# Patient Record
Sex: Male | Born: 1995
Health system: Southern US, Community
[De-identification: ages and names within clinical notes are randomized; demographics above are authoritative.]

## PROBLEM LIST (undated history)

## (undated) DIAGNOSIS — E1110 Type 2 diabetes mellitus with ketoacidosis without coma: Secondary | ICD-10-CM

## (undated) HISTORY — PX: TONSILLECTOMY: SUR1361

---

## 2016-05-09 ENCOUNTER — Encounter (HOSPITAL_COMMUNITY): Payer: Self-pay | Admitting: Emergency Medicine

## 2016-05-09 ENCOUNTER — Ambulatory Visit (HOSPITAL_COMMUNITY)
Admission: EM | Admit: 2016-05-09 | Discharge: 2016-05-09 | Disposition: A | Discharge status: Home / Self Care | Payer: Self-pay | Attending: Family Medicine | Admitting: Family Medicine

## 2016-05-09 DIAGNOSIS — R042 Hemoptysis: Secondary | ICD-10-CM

## 2016-05-09 NOTE — Discharge Instructions (Signed)
Stop making yourself spit up. Get some humidifier to use at home. Let your throat rest. If you constantly trying to spit up, I am afraid that it will further irritate your throat. This will get better. If this persist next week, return or f/u with a primary care doctor for re-evaluation.

## 2016-05-09 NOTE — ED Triage Notes (Signed)
PT reports he has been spitting blood out all day. PT reports that he is not coughing blood up nor throwing up blood. PT believes it is coming from his throat. It started this AM. PT has no recent procedures. PT denies nose bleeds

## 2016-05-09 NOTE — ED Provider Notes (Signed)
CSN: [id number]     Arrival date & time 05/09/16  1650 History   None    Chief Complaint  Patient presents with  . Hemoptysis   (Consider location/radiation/quality/duration/timing/severity/associated sxs/prior Treatment) Patient is a healthy 20 y.o male, woke up this morning and spit and incidentally noticed that he got blood in his spit. Since then, he has constantly spit up on purpose and continues to notice blood in his spit. He reports the blood to be bright red blood. He is otherwise asymptomatic without any URI symptoms, sore throat, coughing, vomiting, abdominal pain, shortness of breath, dental pain, mouth bleeding, or dizziness. Patient is a non-smoker. Patient denies any pain or difficulty swallow. He also doesn't feel the urge or the need to spit.       History reviewed. No pertinent past medical history. Past Surgical History:  Procedure Laterality Date  . TONSILLECTOMY     No family history on file. Social History  Substance Use Topics  . Smoking status: Never Smoker  . Smokeless tobacco: Never Used  . Alcohol use Yes     Comment: rarely    Review of Systems  All other systems reviewed and are negative.   Allergies  Patient has no known allergies.  Home Medications   Prior to Admission medications   Not on File   Meds Ordered and Administered this Visit  Medications - No data to display  BP 125/73   Pulse 83   Temp 99.4 F (37.4 C) (Oral)   Resp 16   Ht 6' (1.829 m)   Wt (!) 320 lb (145.2 kg)   SpO2 100%   BMI 43.40 kg/m  No data found.   Physical Exam  Constitutional: He is oriented to person, place, and time. He appears well-developed and well-nourished.  HENT:  Head: Normocephalic and atraumatic.  Right Ear: External ear normal.  Left Ear: External ear normal.  Nose: Nose normal.  Mouth/Throat: Oropharynx is clear and moist. No oropharyngeal exudate.  TM normal bilaterally  Eyes: EOM are normal. Pupils are equal, round, and reactive  to light. Right eye exhibits no discharge. Left eye exhibits no discharge.  Neck: Normal range of motion. Neck supple.  Cardiovascular: Normal rate, regular rhythm and normal heart sounds.   Pulmonary/Chest: Effort normal and breath sounds normal. No respiratory distress. He has no wheezes.  Abdominal: Soft. Bowel sounds are normal. He exhibits no distension. There is no tenderness.  Musculoskeletal: Normal range of motion.  Lymphadenopathy:    He has no cervical adenopathy.  Neurological: He is alert and oriented to person, place, and time.  Skin: Skin is warm and dry.  Nursing note and vitals reviewed.     Urgent Care Course   Clinical Course     Procedures (including critical care time)  Labs Review Labs Reviewed - No data to display  Imaging Review No results found.   MDM   1. Spitting up blood    Patient purposely spit up a couple times in room to show me what he was talking about. The first two times he spit up, the mucus appear normal without any steaks of redness in it. The third spit, there is some blood in it (see picture above). I am not concern about this as he is not coughing or throwing up blood. This could be secondary to an upper respiratory irritation. Patient informed to monitor symptoms. Informed that this should improved. Patient informed to stop constantly trying to spit up to avoid further irritation  of the throat. Informed to return or f/u if symptoms persist next week.      Lucia EstelleFeng Kimberely Mccannon, NP 05/09/16 [phone]

## 2018-05-11 DIAGNOSIS — E1110 Type 2 diabetes mellitus with ketoacidosis without coma: Secondary | ICD-10-CM

## 2018-05-11 HISTORY — DX: Type 2 diabetes mellitus with ketoacidosis without coma: E11.10

## 2018-05-19 ENCOUNTER — Ambulatory Visit (INDEPENDENT_AMBULATORY_CARE_PROVIDER_SITE_OTHER): Payer: PRIVATE HEALTH INSURANCE

## 2018-05-19 ENCOUNTER — Ambulatory Visit (HOSPITAL_COMMUNITY)
Admission: EM | Admit: 2018-05-19 | Discharge: 2018-05-19 | Disposition: A | Discharge status: Home / Self Care | Payer: PRIVATE HEALTH INSURANCE | Attending: Family Medicine | Admitting: Family Medicine

## 2018-05-19 ENCOUNTER — Encounter (HOSPITAL_COMMUNITY): Payer: Self-pay | Admitting: Emergency Medicine

## 2018-05-19 DIAGNOSIS — E1165 Type 2 diabetes mellitus with hyperglycemia: Secondary | ICD-10-CM

## 2018-05-19 DIAGNOSIS — R1013 Epigastric pain: Secondary | ICD-10-CM

## 2018-05-19 DIAGNOSIS — K5900 Constipation, unspecified: Secondary | ICD-10-CM

## 2018-05-19 LAB — GLUCOSE, CAPILLARY: Glucose-Capillary: 330 mg/dL — ABNORMAL HIGH (ref 70–99)

## 2018-05-19 MED ORDER — METFORMIN HCL 500 MG PO TABS: 500.0000 mg | ORAL_TABLET | Freq: Two times a day (BID) | ORAL | 0 refills | Status: DC

## 2018-05-19 NOTE — ED Triage Notes (Signed)
Pt sts abd pain and some vomiting x 2 weeks

## 2018-05-19 NOTE — Discharge Instructions (Addendum)
You have been diagnosed with diabetes. It is very important that you establish care with a primary care doctor as this will need regular follow up to manage.  You may try over the counter MIRALAX over the next 2-3 days. This has a good chance of helping your constipation which may be causing some of the symptoms you are having.  Please do your best to ensure adequate fluid intake in order to avoid dehydration. If you find that you are unable to tolerate drinking fluids regularly please proceed to the Emergency Department for evaluation.

## 2018-05-19 NOTE — ED Provider Notes (Signed)
[facility]   [id number] 05/19/18 Arrival Time: 1122  ASSESSMENT & PLAN:  1. Epigastric pain   2. Constipation, unspecified constipation type   3. Type 2 diabetes mellitus with hyperglycemia, without long-term current use of insulin (HCC)    New diagnosis of diabetes. FSBS 330 here this morning. I stressed the importance of starting on Metformin and limiting his sugar intake. As he does not have a PCP, I would like him to return here in 48 hours for recheck, sooner if needed. Encouraged adequate fluid intake until follow up.  I have personally viewed the imaging studies ordered this visit. No sign of bowel obstruction. Much stool. No further indication at this time for additional imaging.  Meds ordered this encounter  Medications  . metFORMIN (GLUCOPHAGE) 500 MG tablet    Sig: Take 1 tablet (500 mg total) by mouth 2 (two) times daily with a meal.    Dispense:  60 tablet    Refill:  0     Discharge Instructions     You have been seen today for abdominal pain. Your evaluation was not suggestive of any emergent condition requiring medical intervention at this time. However, some abdominal problems make take more time to appear. Therefore, it's very important for you to pay attention to any new symptoms or worsening of your current condition.  Please return here or to the Emergency Department immediately should you feel worse in any way or have any of the following symptoms: increasing or different abdominal pain, persistent vomiting, fevers, or shaking chills.      Recommend that he establish care with a PCP ASAP, esp with a new diagnosis of DM. He agrees.  Follow-up Information    Schedule an appointment as soon as possible for a [facility]  [facility]   Specialty:  Family Medicine Contact information: [address] [place] [id number] [phone]       [facility].     Specialty:  Emergency Medicine Why:  If symptoms worsen. Contact information: [address] [id number] [place] [id number] [phone]         Reviewed expectations re: course of current medical issues. Questions answered. Outlined signs and symptoms indicating need for more acute intervention. Patient verbalized understanding. After Visit Summary given.   SUBJECTIVE: History from: patient. Erik Harper is a 22 y.o. male who presents with complaint of intermittent epigastric abdominal discomfort along with nausea and vomiting at times. Onset gradual, between 2 and 3 weeks ago. Discomfort described as dull when present; without radiation. May last up to half an hour sometimes. Nausea usually post-prandial with sporadic associated non-bloody vomiting. Symptoms are unchanged since beginning. Fever: absent. Aggravating factors: eating (does tolerate PO fluids without problem). Alleviating factors: have not been identified. Associated symptoms: constipation. Reports last normal bowel movement "last week I think." A few small hard stools a couple of days ago. No diarrhea or loose bowel movements. He denies arthralgias, belching, dysuria, headache, myalgias and sweats. Appetite: normal. PO intake: decreased secondary to above symptoms. Ambulatory without assistance. Urinary symptoms: no dysuria or urinary frequency reported. Has had trouble with constipation in the past. Is passing gas from rectum. Also reports increased thirst over the past few weeks but no specific urinary frequency. OTC treatment: none reported.  Past Surgical History:  Procedure Laterality Date  . TONSILLECTOMY     Social History   Tobacco Use  Smoking Status Never Smoker  Smokeless  Tobacco Never Used   ROS: As per HPI. All other systems negative.  OBJECTIVE:  Vitals:   05/19/18 1309  BP: (!) 156/99  Pulse: (!) 124  Resp: 18  Temp: 97.9 F (36.6 C)  TempSrc: Oral  SpO2: 100%     General appearance: alert, oriented, no acute distress and obese  HEENT: oropharynx moist Lungs: clear to auscultation bilaterally; unlabored respirations Heart: tachycardia noted (recheck 108); regular Abdomen: soft; without distention; mild tenderness, epigastric; normal bowel sounds; without masses or organomegaly; without guarding or rebound tenderness Back: without CVA tenderness; FROM at waist Extremities: without LE edema; symmetrical; without gross deformities Skin: warm and dry Neurologic: normal gait Psychological: alert and cooperative; normal mood and affect  Labs:  Labs Reviewed  GLUCOSE, CAPILLARY - Abnormal; Notable for the following components:      Result Value   Glucose-Capillary 330 (*)    All other components within normal limits  CBG MONITORING, ED   Imaging: Dg Chest Port 1 View  Result Date: 05/21/2018 CLINICAL DATA:  22 year old male with epigastric pain. EXAM: PORTABLE CHEST 1 VIEW COMPARISON:  None. FINDINGS: The heart size and mediastinal contours are within normal limits. Both lungs are clear. The visualized skeletal structures are unremarkable. IMPRESSION: No active disease. Electronically Signed   By: Elgie Collard M.D.   On: 05/21/2018 06:11     No Known Allergies                                             PMH: Constipation.  Social History   Socioeconomic History  . Marital status: Single    Spouse name: Not on file  . Number of children: Not on file  . Years of education: Not on file  . Highest education level: Not on file  Occupational History  . Not on file  Social Needs  . Financial resource strain: Not on file  . Food insecurity:    Worry: Not on file    Inability: Not on file  . Transportation needs:    Medical: Not on file    Non-medical: Not on file  Tobacco Use  . Smoking status: Never Smoker  . Smokeless tobacco: Never Used  Substance and Sexual Activity  . Alcohol use: Yes    Comment: rarely  . Drug use: No     Comment: PT reports marijuana use in the past  . Sexual activity: Not on file  Lifestyle  . Physical activity:    Days per week: Not on file    Minutes per session: Not on file  . Stress: Not on file  Relationships  . Social connections:    Talks on phone: Not on file    Gets together: Not on file    Attends religious service: Not on file    Active member of club or organization: Not on file    Attends meetings of clubs or organizations: Not on file    Relationship status: Not on file  . Intimate partner violence:    Fear of current or ex partner: Not on file    Emotionally abused: Not on file    Physically abused: Not on file    Forced sexual activity: Not on file  Other Topics Concern  . Not on file  Social History Narrative  . Not on file   FH: diabetes.   Mardella Layman, MD 05/21/18  0936  

## 2018-05-20 ENCOUNTER — Inpatient Hospital Stay (HOSPITAL_COMMUNITY)
Admission: EM | Admit: 2018-05-20 | Discharge: 2018-05-23 | DRG: 638 | Disposition: A | Discharge status: Home / Self Care | Payer: Medicaid - Out of State | Attending: Internal Medicine | Admitting: Internal Medicine

## 2018-05-20 ENCOUNTER — Other Ambulatory Visit: Payer: Self-pay

## 2018-05-20 ENCOUNTER — Encounter (HOSPITAL_COMMUNITY): Payer: Self-pay | Admitting: Emergency Medicine

## 2018-05-20 DIAGNOSIS — E1110 Type 2 diabetes mellitus with ketoacidosis without coma: Principal | ICD-10-CM | POA: Diagnosis present

## 2018-05-20 DIAGNOSIS — Z6841 Body Mass Index (BMI) 40.0 and over, adult: Secondary | ICD-10-CM

## 2018-05-20 DIAGNOSIS — R109 Unspecified abdominal pain: Secondary | ICD-10-CM | POA: Diagnosis present

## 2018-05-20 DIAGNOSIS — Z833 Family history of diabetes mellitus: Secondary | ICD-10-CM

## 2018-05-20 DIAGNOSIS — I10 Essential (primary) hypertension: Secondary | ICD-10-CM | POA: Diagnosis present

## 2018-05-20 DIAGNOSIS — E1310 Other specified diabetes mellitus with ketoacidosis without coma: Secondary | ICD-10-CM

## 2018-05-20 DIAGNOSIS — R1013 Epigastric pain: Secondary | ICD-10-CM

## 2018-05-20 DIAGNOSIS — F1290 Cannabis use, unspecified, uncomplicated: Secondary | ICD-10-CM | POA: Diagnosis present

## 2018-05-20 DIAGNOSIS — E876 Hypokalemia: Secondary | ICD-10-CM | POA: Diagnosis present

## 2018-05-20 DIAGNOSIS — R7989 Other specified abnormal findings of blood chemistry: Secondary | ICD-10-CM | POA: Diagnosis present

## 2018-05-20 DIAGNOSIS — N179 Acute kidney failure, unspecified: Secondary | ICD-10-CM

## 2018-05-20 DIAGNOSIS — E861 Hypovolemia: Secondary | ICD-10-CM | POA: Diagnosis present

## 2018-05-20 DIAGNOSIS — R112 Nausea with vomiting, unspecified: Secondary | ICD-10-CM | POA: Diagnosis present

## 2018-05-20 HISTORY — DX: Type 2 diabetes mellitus with ketoacidosis without coma: E11.10

## 2018-05-20 MED ORDER — ONDANSETRON 4 MG PO TBDP
4.0000 mg | ORAL_TABLET | Freq: Once | ORAL | Status: AC | PRN
  Administered 2018-05-21: 4 mg via ORAL
  Filled 2018-05-20: qty 1

## 2018-05-20 NOTE — ED Triage Notes (Signed)
Pt reports 7/10 upper abd pain that has been going on since after Thanksgiving. N/V. Denies diarrhea. Pt was diagnosed at urgent care 2 days ago with constipation. Pt reports he has not been able to keep the otc medications down. Pt reports last BM was a week ago or more.

## 2018-05-21 ENCOUNTER — Emergency Department (HOSPITAL_COMMUNITY): Payer: Medicaid - Out of State

## 2018-05-21 ENCOUNTER — Encounter (HOSPITAL_COMMUNITY): Payer: Self-pay | Admitting: Internal Medicine

## 2018-05-21 ENCOUNTER — Observation Stay (HOSPITAL_COMMUNITY): Payer: Medicaid - Out of State

## 2018-05-21 DIAGNOSIS — N179 Acute kidney failure, unspecified: Secondary | ICD-10-CM | POA: Diagnosis not present

## 2018-05-21 DIAGNOSIS — E1110 Type 2 diabetes mellitus with ketoacidosis without coma: Secondary | ICD-10-CM | POA: Diagnosis present

## 2018-05-21 DIAGNOSIS — E0810 Diabetes mellitus due to underlying condition with ketoacidosis without coma: Secondary | ICD-10-CM | POA: Diagnosis not present

## 2018-05-21 DIAGNOSIS — R112 Nausea with vomiting, unspecified: Secondary | ICD-10-CM | POA: Diagnosis not present

## 2018-05-21 DIAGNOSIS — R109 Unspecified abdominal pain: Secondary | ICD-10-CM | POA: Diagnosis present

## 2018-05-21 LAB — BASIC METABOLIC PANEL
Anion gap: 22 — ABNORMAL HIGH (ref 5–15)
Anion gap: 19 — ABNORMAL HIGH (ref 5–15)
Anion gap: 14 (ref 5–15)
Anion gap: 12 (ref 5–15)
BUN: 8 mg/dL (ref 6–20)
BUN: 7 mg/dL (ref 6–20)
BUN: 6 mg/dL (ref 6–20)
BUN: <5 mg/dL — ABNORMAL LOW (ref 6–20)
CALCIUM: 9.8 mg/dL (ref 8.9–10.3)
CHLORIDE: 99 mmol/L (ref 98–111)
CHLORIDE: 105 mmol/L (ref 98–111)
CO2: 11 mmol/L — ABNORMAL LOW (ref 22–32)
CO2: 10 mmol/L — ABNORMAL LOW (ref 22–32)
CO2: 16 mmol/L — ABNORMAL LOW (ref 22–32)
CO2: 17 mmol/L — ABNORMAL LOW (ref 22–32)
CREATININE: 1.01 mg/dL (ref 0.61–1.24)
Calcium: 9.6 mg/dL (ref 8.9–10.3)
Calcium: 9.5 mg/dL (ref 8.9–10.3)
Calcium: 9.3 mg/dL (ref 8.9–10.3)
Chloride: 106 mmol/L (ref 98–111)
Chloride: 106 mmol/L (ref 98–111)
Creatinine, Ser: 1.45 mg/dL — ABNORMAL HIGH (ref 0.61–1.24)
Creatinine, Ser: 1.18 mg/dL (ref 0.61–1.24)
Creatinine, Ser: 1.13 mg/dL (ref 0.61–1.24)
GFR calc Af Amer: >60 mL/min (ref >60)
GFR calc Af Amer: >60 mL/min (ref >60)
GFR calc non Af Amer: >60 mL/min (ref >60)
GFR calc non Af Amer: >60 mL/min (ref >60)
GLUCOSE: 186 mg/dL — AB (ref 70–99)
Glucose, Bld: 354 mg/dL — ABNORMAL HIGH (ref 70–99)
Glucose, Bld: 253 mg/dL — ABNORMAL HIGH (ref 70–99)
Glucose, Bld: 210 mg/dL — ABNORMAL HIGH (ref 70–99)
Potassium: 4.4 mmol/L (ref 3.5–5.1)
Potassium: 4.1 mmol/L (ref 3.5–5.1)
Potassium: 3.5 mmol/L (ref 3.5–5.1)
Potassium: 3.2 mmol/L — ABNORMAL LOW (ref 3.5–5.1)
Sodium: 132 mmol/L — ABNORMAL LOW (ref 135–145)
Sodium: 135 mmol/L (ref 135–145)
Sodium: 136 mmol/L (ref 135–145)
Sodium: 134 mmol/L — ABNORMAL LOW (ref 135–145)

## 2018-05-21 LAB — CBG MONITORING, ED
Glucose-Capillary: 337 mg/dL — ABNORMAL HIGH (ref 70–99)
Glucose-Capillary: 298 mg/dL — ABNORMAL HIGH (ref 70–99)
Glucose-Capillary: 264 mg/dL — ABNORMAL HIGH (ref 70–99)
Glucose-Capillary: 239 mg/dL — ABNORMAL HIGH (ref 70–99)
Glucose-Capillary: 231 mg/dL — ABNORMAL HIGH (ref 70–99)
Glucose-Capillary: 236 mg/dL — ABNORMAL HIGH (ref 70–99)
Glucose-Capillary: 255 mg/dL — ABNORMAL HIGH (ref 70–99)
Glucose-Capillary: 252 mg/dL — ABNORMAL HIGH (ref 70–99)
Glucose-Capillary: 210 mg/dL — ABNORMAL HIGH (ref 70–99)
Glucose-Capillary: 209 mg/dL — ABNORMAL HIGH (ref 70–99)

## 2018-05-21 LAB — COMPREHENSIVE METABOLIC PANEL
ALT: 72 U/L — ABNORMAL HIGH (ref 0–44)
AST: 48 U/L — ABNORMAL HIGH (ref 15–41)
Albumin: 4.8 g/dL (ref 3.5–5.0)
Alkaline Phosphatase: 77 U/L (ref 38–126)
Anion gap: 23 — ABNORMAL HIGH (ref 5–15)
BUN: 9 mg/dL (ref 6–20)
CHLORIDE: 99 mmol/L (ref 98–111)
CO2: 12 mmol/L — ABNORMAL LOW (ref 22–32)
Calcium: 10.3 mg/dL (ref 8.9–10.3)
Creatinine, Ser: 1.47 mg/dL — ABNORMAL HIGH (ref 0.61–1.24)
GFR calc Af Amer: >60 mL/min (ref >60)
GFR calc non Af Amer: >60 mL/min (ref >60)
Glucose, Bld: 394 mg/dL — ABNORMAL HIGH (ref 70–99)
POTASSIUM: 4.5 mmol/L (ref 3.5–5.1)
Sodium: 134 mmol/L — ABNORMAL LOW (ref 135–145)
Total Bilirubin: 1.9 mg/dL — ABNORMAL HIGH (ref 0.3–1.2)
Total Protein: 9.3 g/dL — ABNORMAL HIGH (ref 6.5–8.1)

## 2018-05-21 LAB — CBC
HCT: 48.5 % (ref 39.0–52.0)
HCT: 50.3 % (ref 39.0–52.0)
Hemoglobin: 15.3 g/dL (ref 13.0–17.0)
Hemoglobin: 15.8 g/dL (ref 13.0–17.0)
MCH: 26.5 pg (ref 26.0–34.0)
MCH: 26.7 pg (ref 26.0–34.0)
MCHC: 31.4 g/dL (ref 30.0–36.0)
MCHC: 31.5 g/dL (ref 30.0–36.0)
MCV: 84.3 fL (ref 80.0–100.0)
MCV: 84.8 fL (ref 80.0–100.0)
PLATELETS: 358 10*3/uL (ref 150–400)
PLATELETS: 383 10*3/uL (ref 150–400)
RBC: 5.72 MIL/uL (ref 4.22–5.81)
RBC: 5.97 MIL/uL — AB (ref 4.22–5.81)
RDW: 12.6 % (ref 11.5–15.5)
RDW: 12.7 % (ref 11.5–15.5)
WBC: 13.4 10*3/uL — AB (ref 4.0–10.5)
WBC: 15.6 10*3/uL — ABNORMAL HIGH (ref 4.0–10.5)
nRBC: 0 % (ref 0.0–0.2)
nRBC: 0 % (ref 0.0–0.2)

## 2018-05-21 LAB — GLUCOSE, CAPILLARY
GLUCOSE-CAPILLARY: 157 mg/dL — AB (ref 70–99)
Glucose-Capillary: 167 mg/dL — ABNORMAL HIGH (ref 70–99)
Glucose-Capillary: 170 mg/dL — ABNORMAL HIGH (ref 70–99)
Glucose-Capillary: 173 mg/dL — ABNORMAL HIGH (ref 70–99)
Glucose-Capillary: 176 mg/dL — ABNORMAL HIGH (ref 70–99)
Glucose-Capillary: 182 mg/dL — ABNORMAL HIGH (ref 70–99)
Glucose-Capillary: 195 mg/dL — ABNORMAL HIGH (ref 70–99)
Glucose-Capillary: 199 mg/dL — ABNORMAL HIGH (ref 70–99)

## 2018-05-21 LAB — TROPONIN I: Troponin I: <0.03 ng/mL (ref <0.03)

## 2018-05-21 LAB — HIV ANTIBODY (ROUTINE TESTING W REFLEX): HIV Screen 4th Generation wRfx: NONREACTIVE

## 2018-05-21 LAB — URINALYSIS, ROUTINE W REFLEX MICROSCOPIC
BACTERIA UA: NONE SEEN
Bilirubin Urine: NEGATIVE
Ketones, ur: 80 mg/dL — AB
LEUKOCYTES UA: NEGATIVE
Nitrite: NEGATIVE
PH: 5 (ref 5.0–8.0)
Protein, ur: 100 mg/dL — AB
Specific Gravity, Urine: 1.027 (ref 1.005–1.030)

## 2018-05-21 LAB — LIPASE, BLOOD: Lipase: 47 U/L (ref 11–51)

## 2018-05-21 LAB — MRSA PCR SCREENING: MRSA by PCR: NEGATIVE

## 2018-05-21 MED ORDER — SODIUM CHLORIDE 0.9 % IV SOLN
INTRAVENOUS | Status: DC
  Administered 2018-05-21: 03:00:00 via INTRAVENOUS

## 2018-05-21 MED ORDER — INSULIN REGULAR(HUMAN) IN NACL 100-0.9 UT/100ML-% IV SOLN
INTRAVENOUS | Status: DC
  Administered 2018-05-21: 2.8 [IU]/h via INTRAVENOUS
  Filled 2018-05-21: qty 100

## 2018-05-21 MED ORDER — SODIUM CHLORIDE 0.9 % IV SOLN
INTRAVENOUS | Status: DC
  Administered 2018-05-21: 12:00:00 via INTRAVENOUS

## 2018-05-21 MED ORDER — ENOXAPARIN SODIUM 40 MG/0.4ML ~~LOC~~ SOLN
40.0000 mg | Freq: Every day | SUBCUTANEOUS | Status: DC
  Administered 2018-05-21 – 2018-05-23 (×3): 40 mg via SUBCUTANEOUS
  Filled 2018-05-21 (×3): qty 0.4

## 2018-05-21 MED ORDER — DEXTROSE-NACL 5-0.45 % IV SOLN: INTRAVENOUS | Status: DC

## 2018-05-21 MED ORDER — SODIUM CHLORIDE 0.9 % IV BOLUS
1000.0000 mL | Freq: Once | INTRAVENOUS | Status: AC
  Administered 2018-05-21: 1000 mL via INTRAVENOUS

## 2018-05-21 MED ORDER — FAMOTIDINE IN NACL 20-0.9 MG/50ML-% IV SOLN
20.0000 mg | INTRAVENOUS | Status: DC
  Administered 2018-05-21 – 2018-05-22 (×2): 20 mg via INTRAVENOUS
  Filled 2018-05-21 (×3): qty 50

## 2018-05-21 MED ORDER — INSULIN REGULAR(HUMAN) IN NACL 100-0.9 UT/100ML-% IV SOLN
INTRAVENOUS | Status: DC
  Administered 2018-05-21: 10 [IU]/h via INTRAVENOUS
  Administered 2018-05-22: 7.4 [IU]/h via INTRAVENOUS
  Administered 2018-05-22: 8.8 [IU]/h via INTRAVENOUS
  Administered 2018-05-22: 7.4 [IU]/h via INTRAVENOUS
  Administered 2018-05-22: 7 [IU]/h via INTRAVENOUS
  Administered 2018-05-22: 6.9 [IU]/h via INTRAVENOUS
  Filled 2018-05-21 (×2): qty 100

## 2018-05-21 MED ORDER — DEXTROSE-NACL 5-0.45 % IV SOLN
INTRAVENOUS | Status: DC
  Administered 2018-05-21 – 2018-05-22 (×2): via INTRAVENOUS

## 2018-05-21 MED ORDER — IOHEXOL 300 MG/ML  SOLN
125.0000 mL | Freq: Once | INTRAMUSCULAR | Status: AC | PRN
  Administered 2018-05-21: 100 mL via INTRAVENOUS

## 2018-05-21 NOTE — ED Notes (Signed)
Paged diabetic coordinator.

## 2018-05-21 NOTE — ED Provider Notes (Signed)
[facility] EMERGENCY DEPARTMENT Provider Note   CSN: [id number] Arrival date & time: 05/20/18  2257     History   Chief Complaint Chief Complaint  Patient presents with  . Abdominal Pain  . Constipation    HPI Erik Harper is a 22 y.o. male with no pertinent past medical history who presents to the emergency department with a chief complaint of abdominal pain.  The patient reports that he has been feeling generally unwell since Thanksgiving.  He reports upper abdominal pain, nausea, vomiting, polyuria, and polydipsia.  He states that he thinks that his last bowel movement was over a week ago.  He was seen at urgent care 2 days ago.  Per chart review, urgent care note is pending at this time.  There appears to be a prescription that he was given for metformin, but he states that he has not been taking this medication.   He reports he developed significantly worsening vomiting earlier today.  He has not been able to keep any medications down prior to arrival this evening.  Last episode of vomiting was just prior to arrival in the ED.  He states that he thinks his last bowel movement was more than a week ago.  No history of constipation.  He denies fever, chills, diarrhea, dysuria, hematuria, penile or testicular pain or swelling, headache, dizziness, lightheadedness.  He reports a family history of diabetes mellitus.  No personal history of diabetes mellitus.  He is a never smoker.  He reports marijuana use.  He reports minimal alcohol use, and states "I cannot remember the last time that I had an alcoholic beverage."  The history is provided by the patient. No language interpreter was used.    History reviewed. No pertinent past medical history.  Patient Active Problem List   Diagnosis Date Noted  . DKA (diabetic ketoacidoses) (HCC) 05/21/2018  . ARF (acute renal failure) (HCC) 05/21/2018  . Abdominal pain 05/21/2018  . Nausea & vomiting 05/21/2018     Past Surgical History:  Procedure Laterality Date  . TONSILLECTOMY          Home Medications    Prior to Admission medications   Medication Sig Start Date End Date Taking? Authorizing Provider  metFORMIN (GLUCOPHAGE) 500 MG tablet Take 1 tablet (500 mg total) by mouth 2 (two) times daily with a meal. Patient not taking: Reported on 05/21/2018 05/19/18   Mardella Layman, MD    Family History Family History  Problem Relation Age of Onset  . Diabetes Mellitus II Mother     Social History Social History   Tobacco Use  . Smoking status: Never Smoker  . Smokeless tobacco: Never Used  Substance Use Topics  . Alcohol use: Yes    Comment: rarely  . Drug use: No    Comment: PT reports marijuana use in the past     Allergies   Patient has no known allergies.   Review of Systems Review of Systems  Constitutional: Negative for appetite change, chills and fever.  HENT: Negative for congestion and sore throat.   Eyes: Negative for visual disturbance.  Respiratory: Negative for cough and shortness of breath.   Cardiovascular: Negative for chest pain.  Gastrointestinal: Positive for abdominal pain, constipation, nausea and vomiting. Negative for diarrhea.  Endocrine: Positive for polydipsia and polyuria.  Genitourinary: Negative for dysuria and urgency.  Musculoskeletal: Negative for back pain, neck pain and neck stiffness.  Skin: Negative for rash.  Allergic/Immunologic: Negative for immunocompromised state.  Neurological: Negative for weakness, numbness and headaches.  Psychiatric/Behavioral: Negative for confusion.   Physical Exam Updated Vital Signs BP 121/66   Pulse (!) 111   Temp 98.9 F (37.2 C)   Resp 17   Ht 6\' 1"  (1.854 m)   Wt (!) 142.9 kg   SpO2 97%   BMI 41.56 kg/m   Physical Exam  Constitutional: He appears well-developed.  Non-toxic appearance. He appears ill. No distress.  HENT:  Head: Normocephalic.  Eyes: Conjunctivae are normal.  Neck:  Neck supple.  Cardiovascular: Normal rate, regular rhythm, normal heart sounds and intact distal pulses. Exam reveals no gallop and no friction rub.  No murmur heard. Pulmonary/Chest: Effort normal. No stridor. No respiratory distress. He has no wheezes. He has no rales. He exhibits no tenderness.  Abdominal: Soft. He exhibits no distension and no mass. There is tenderness. There is no rebound and no guarding. No hernia.  Normoactive bowel sounds.  Diffusely tender to palpation throughout the abdomen, upper greater than lower.  Abdomen is distended, but soft.  No rebound or guarding.  No CVA tenderness bilaterally.  Neurological: He is alert.  Skin: Skin is warm and dry. Capillary refill takes less than 2 seconds. No rash noted. No erythema. No pallor.  Psychiatric: His behavior is normal.  Nursing note and vitals reviewed.  ED Treatments / Results  Labs (all labs ordered are listed, but only abnormal results are displayed) Labs Reviewed  COMPREHENSIVE METABOLIC PANEL - Abnormal; Notable for the following components:      Result Value   Sodium 134 (*)    CO2 12 (*)    Glucose, Bld 394 (*)    Creatinine, Ser 1.47 (*)    Total Protein 9.3 (*)    AST 48 (*)    ALT 72 (*)    Total Bilirubin 1.9 (*)    Anion gap 23 (*)    All other components within normal limits  CBC - Abnormal; Notable for the following components:   WBC 15.6 (*)    RBC 5.97 (*)    All other components within normal limits  URINALYSIS, ROUTINE W REFLEX MICROSCOPIC - Abnormal; Notable for the following components:   Glucose, UA >=500 (*)    Hgb urine dipstick MODERATE (*)    Ketones, ur 80 (*)    Protein, ur 100 (*)    All other components within normal limits  BASIC METABOLIC PANEL - Abnormal; Notable for the following components:   Sodium 132 (*)    CO2 11 (*)    Glucose, Bld 354 (*)    Creatinine, Ser 1.45 (*)    Anion gap 22 (*)    All other components within normal limits  CBC - Abnormal; Notable for the  following components:   WBC 13.4 (*)    All other components within normal limits  CBG MONITORING, ED - Abnormal; Notable for the following components:   Glucose-Capillary 337 (*)    All other components within normal limits  CBG MONITORING, ED - Abnormal; Notable for the following components:   Glucose-Capillary 298 (*)    All other components within normal limits  CBG MONITORING, ED - Abnormal; Notable for the following components:   Glucose-Capillary 264 (*)    All other components within normal limits  LIPASE, BLOOD  TROPONIN I  HIV ANTIBODY (ROUTINE TESTING W REFLEX)  BASIC METABOLIC PANEL  BASIC METABOLIC PANEL  BASIC METABOLIC PANEL  HEPATITIS PANEL, ACUTE    EKG None  Radiology Ct  Abdomen Pelvis W Contrast  Result Date: 05/21/2018 CLINICAL DATA:  22 year old male with nausea and vomiting. EXAM: CT ABDOMEN AND PELVIS WITH CONTRAST TECHNIQUE: Multidetector CT imaging of the abdomen and pelvis was performed using the standard protocol following bolus administration of intravenous contrast. CONTRAST:  100mL OMNIPAQUE IOHEXOL 300 MG/ML  SOLN COMPARISON:  Abdominal radiograph dated 05/19/2018 FINDINGS: Lower chest: The visualized lung bases are clear. No intra-abdominal free air or free fluid. Hepatobiliary: Severe diffuse fatty infiltration of the liver. No intrahepatic biliary ductal dilatation. The gallbladder is unremarkable. Pancreas: Unremarkable. No pancreatic ductal dilatation or surrounding inflammatory changes. Spleen: Apparent ill-defined 15 mm low attenuating area in the spleen anteriorly, likely artifactual and related to timing of the contrast. The spleen is otherwise unremarkable. Adrenals/Urinary Tract: Adrenal glands are unremarkable. Kidneys are normal, without renal calculi, focal lesion, or hydronephrosis. Bladder is unremarkable. Stomach/Bowel: Stomach is within normal limits. Appendix appears normal. No evidence of bowel wall thickening, distention, or inflammatory  changes. Vascular/Lymphatic: No significant vascular findings are present. No enlarged abdominal or pelvic lymph nodes. Reproductive: The prostate and seminal vesicles are grossly unremarkable. No pelvic mass Other: There is a small fat containing umbilical hernia. There is slightly thickened appearance of the umbilical skin and fat. Although this may be chronic. Correlation with clinical exam and point tenderness recommended to exclude and acute inflammatory process such as strangulated or incarcerated fat. Musculoskeletal: No acute or significant osseous findings. IMPRESSION: 1. No acute intra-abdominal or pelvic pathology. No bowel obstruction or active inflammation. Normal appendix. 2. Severe fatty liver. 3. Small fat containing umbilical hernia. Mild thickened appearance of the umbilical skin and fat may be chronic. Correlation with clinical exam and point tenderness recommended to exclude and acute inflammatory process such as strangulated or incarcerated fat. Electronically Signed   By: Elgie CollardArash  Radparvar M.D.   On: 05/21/2018 03:18   Dg Chest Port 1 View  Result Date: 05/21/2018 CLINICAL DATA:  22 year old male with epigastric pain. EXAM: PORTABLE CHEST 1 VIEW COMPARISON:  None. FINDINGS: The heart size and mediastinal contours are within normal limits. Both lungs are clear. The visualized skeletal structures are unremarkable. IMPRESSION: No active disease. Electronically Signed   By: Elgie CollardArash  Radparvar M.D.   On: 05/21/2018 06:11   Dg Abd 2 Views  Result Date: 05/19/2018 CLINICAL DATA:  Abdominal pain. Nausea and vomiting. Constipation. EXAM: ABDOMEN - 2 VIEW COMPARISON:  None. FINDINGS: The bowel gas pattern is normal. There is no evidence of free air. No radio-opaque calculi or other significant radiographic abnormality is seen. No excessive stool burden. No fecal impaction. IMPRESSION: Benign-appearing abdomen. Electronically Signed   By: Francene BoyersJames  Maxwell M.D.   On: 05/19/2018 14:17     Procedures .Critical Care Performed by: Barkley BoardsMcDonald, Falan Hensler A, PA-C Authorized by: Barkley BoardsMcDonald, Colum Colt A, PA-C   Critical care provider statement:    Critical care time (minutes):  35   Critical care time was exclusive of:  Separately billable procedures and treating other patients and teaching time   Critical care was necessary to treat or prevent imminent or life-threatening deterioration of the following conditions:  Metabolic crisis   Critical care was time spent personally by me on the following activities:  Discussions with consultants, evaluation of patient's response to treatment, examination of patient, ordering and performing treatments and interventions, ordering and review of laboratory studies, ordering and review of radiographic studies, pulse oximetry, re-evaluation of patient's condition, obtaining history from patient or surrogate and review of old charts   (including critical care  time)  Medications Ordered in ED Medications  sodium chloride 0.9 % bolus 1,000 mL (1,000 mLs Intravenous New Bag/Given 05/21/18 0327)    And  0.9 %  sodium chloride infusion ( Intravenous New Bag/Given 05/21/18 0329)  0.9 %  sodium chloride infusion (has no administration in time range)  dextrose 5 %-0.45 % sodium chloride infusion (has no administration in time range)  insulin regular, human (MYXREDLIN) 100 units/ 100 mL infusion (has no administration in time range)  enoxaparin (LOVENOX) injection 40 mg (has no administration in time range)  ondansetron (ZOFRAN-ODT) disintegrating tablet 4 mg (4 mg Oral Given 05/21/18 0059)  iohexol (OMNIPAQUE) 300 MG/ML solution 125 mL (100 mLs Intravenous Contrast Given 05/21/18 0256)     Initial Impression / Assessment and Plan / ED Course  I have reviewed the triage vital signs and the nursing notes.  Pertinent labs & imaging results that were available during my care of the patient were reviewed by me and considered in my medical decision making (see chart  for details).     22 year old male with no pertinent past medical history presenting to the ER with abdominal pain, nausea, vomiting, constipation, polyuria, polydipsia for almost 2 weeks.  The patient was discussed with Dr. Rhunette Croft, attending physician.  He is tachycardic in the 120s, but afebrile and normotensive.  On exam, he appears ill appearing.  Abdomen is diffusely tender to palpation and distended, but soft.  No peritoneal signs.  Glucose is 394 with a bicarb of 12 and anion gap of 23.  Labs are also notable for AST of 48, ALT 42, and total bilirubin of 1.9.  Creatinine is 1.47.  No previous available for comparison.  UA with glucosuria, proteinuria, and ketonuria.  Chest x-ray is unremarkable.  CT abdomen pelvis with small fat-containing umbilical hernia, which is thought to be chronic and severe fatty liver.  Exam is otherwise unremarkable.  Glucose stabilizer initiated in the ED.  Consulted the hospitalist team and Dr. Toniann Fail will admit for DKA and AKI. The patient appears reasonably stabilized for admission considering the current resources, flow, and capabilities available in the ED at this time, and I doubt any other [facility] requiring further screening and/or treatment in the ED prior to admission.  Final Clinical Impressions(s) / ED Diagnoses   Final diagnoses:  Diabetic ketoacidosis without coma associated with other specified diabetes mellitus (HCC)  AKI (acute kidney injury) [facility])    ED Discharge Orders    None       Carlosdaniel Grob A, PA-C 05/21/18 0704    Derwood Kaplan, MD 05/21/18 2328

## 2018-05-21 NOTE — H&P (Signed)
History and Physical    Erik Harper ZOX:[id number] DOB: Oct 02, 1995 DOA: 05/20/2018  PCP: Patient, No Pcp Per  Patient coming from: Home.  Chief Complaint: Abdominal pain nausea vomiting.  HPI: Erik Harper is a 22 y.o. male with recently diagnosed diabetes mellitus type 2 3 days ago placed on metformin which patient has not taken because of persistent nausea vomiting presents to the ER.  Patient states over the last 1 week patient has been having persistent nausea vomiting with epigastric pain.  Denies chest pain shortness of breath fever chills or any productive cough.  Pain is mostly in the epigastric area.  Has not moved his bowels for last 1 week.  Had gone to urgent care center 3 days ago and was diagnosed with diabetes and was placed on metformin which patient has not taken because of vomiting.  ED Course: In the ER patient is found to have blood sugar of 394 bicarb of 12 anion gap of 23 UA shows ketones.  LFTs are mildly elevated.  Due to persistent abdominal pain had a CT abdomen which shows severe fatty liver with umbilical hernia containing fat.  On exam patient abdomen appears benign.  Patient was started on IV fluids for DKA along with IV insulin infusion.  Review of Systems: As per HPI, rest all negative.   History reviewed. No pertinent past medical history.  Past Surgical History:  Procedure Laterality Date  . TONSILLECTOMY       reports that he has never smoked. He has never used smokeless tobacco. He reports that he drinks alcohol. He reports that he does not use drugs.  No Known Allergies  Family History  Problem Relation Age of Onset  . Diabetes Mellitus II Mother     Prior to Admission medications   Medication Sig Start Date End Date Taking? Authorizing Provider  metFORMIN (GLUCOPHAGE) 500 MG tablet Take 1 tablet (500 mg total) by mouth 2 (two) times daily with a meal. Patient not taking: Reported on 05/21/2018 05/19/18   Mardella Layman, MD     Physical Exam: Vitals:   05/20/18 2350 05/21/18 0126  BP: (!) 148/108 (!) 152/107  Pulse: 98 (!) 125  Resp: 17   Temp: 98.9 F (37.2 C)   SpO2: 98% 99%  Weight: (!) 142.9 kg   Height: 6\' 1"  (1.854 m)       Constitutional: Moderately built and nourished. Vitals:   05/20/18 2350 05/21/18 0126  BP: (!) 148/108 (!) 152/107  Pulse: 98 (!) 125  Resp: 17   Temp: 98.9 F (37.2 C)   SpO2: 98% 99%  Weight: (!) 142.9 kg   Height: 6\' 1"  (1.854 m)    Eyes: Anicteric no pallor. ENMT: No discharge from the ears eyes nose or mouth. Neck: No mass felt.  No neck rigidity. Respiratory: No rhonchi or crepitations. Cardiovascular: S1-S2 heard. Abdomen: Soft nontender bowel sounds present. Musculoskeletal: No edema. Skin: No rash. Neurologic: Alert awake oriented to time place and person.  Moves all extremities. Psychiatric: Appears normal per normal affect.   Labs on Admission: I have personally reviewed following labs and imaging studies  CBC: Recent Labs  Lab 05/20/18 2359  WBC 15.6*  HGB 15.8  HCT 50.3  MCV 84.3  PLT 383   Basic Metabolic Panel: Recent Labs  Lab 05/20/18 2359  NA 134*  K 4.5  CL 99  CO2 12*  GLUCOSE 394*  BUN 9  CREATININE 1.47*  CALCIUM 10.3   GFR: Estimated Creatinine Clearance: 118.2 mL/min (  A) (by C-G formula based on SCr of 1.47 mg/dL (H)). Liver Function Tests: Recent Labs  Lab 05/20/18 2359  AST 48*  ALT 72*  ALKPHOS 77  BILITOT 1.9*  PROT 9.3*  ALBUMIN 4.8   Recent Labs  Lab 05/20/18 2359  LIPASE 47   No results for input(s): AMMONIA in the last 168 hours. Coagulation Profile: No results for input(s): INR, PROTIME in the last 168 hours. Cardiac Enzymes: No results for input(s): CKTOTAL, CKMB, CKMBINDEX, TROPONINI in the last 168 hours. BNP (last 3 results) No results for input(s): PROBNP in the last 8760 hours. HbA1C: No results for input(s): HGBA1C in the last 72 hours. CBG: Recent Labs  Lab 05/19/18 1420  05/21/18 0324  GLUCAP 330* 337*   Lipid Profile: No results for input(s): CHOL, HDL, LDLCALC, TRIG, CHOLHDL, LDLDIRECT in the last 72 hours. Thyroid Function Tests: No results for input(s): TSH, T4TOTAL, FREET4, T3FREE, THYROIDAB in the last 72 hours. Anemia Panel: No results for input(s): VITAMINB12, FOLATE, FERRITIN, TIBC, IRON, RETICCTPCT in the last 72 hours. Urine analysis:    Component Value Date/Time   COLORURINE YELLOW 05/20/2018 2356   APPEARANCEUR CLEAR 05/20/2018 2356   LABSPEC 1.027 05/20/2018 2356   PHURINE 5.0 05/20/2018 2356   GLUCOSEU >=500 (A) 05/20/2018 2356   HGBUR MODERATE (A) 05/20/2018 2356   BILIRUBINUR NEGATIVE 05/20/2018 2356   KETONESUR 80 (A) 05/20/2018 2356   PROTEINUR 100 (A) 05/20/2018 2356   NITRITE NEGATIVE 05/20/2018 2356   LEUKOCYTESUR NEGATIVE 05/20/2018 2356   Sepsis Labs: @LABRCNTIP (procalcitonin:4,lacticidven:4) )No results found for this or any previous visit (from the past 240 hour(s)).   Radiological Exams on Admission: Ct Abdomen Pelvis W Contrast  Result Date: 05/21/2018 CLINICAL DATA:  22 year old male with nausea and vomiting. EXAM: CT ABDOMEN AND PELVIS WITH CONTRAST TECHNIQUE: Multidetector CT imaging of the abdomen and pelvis was performed using the standard protocol following bolus administration of intravenous contrast. CONTRAST:  100mL OMNIPAQUE IOHEXOL 300 MG/ML  SOLN COMPARISON:  Abdominal radiograph dated 05/19/2018 FINDINGS: Lower chest: The visualized lung bases are clear. No intra-abdominal free air or free fluid. Hepatobiliary: Severe diffuse fatty infiltration of the liver. No intrahepatic biliary ductal dilatation. The gallbladder is unremarkable. Pancreas: Unremarkable. No pancreatic ductal dilatation or surrounding inflammatory changes. Spleen: Apparent ill-defined 15 mm low attenuating area in the spleen anteriorly, likely artifactual and related to timing of the contrast. The spleen is otherwise unremarkable.  Adrenals/Urinary Tract: Adrenal glands are unremarkable. Kidneys are normal, without renal calculi, focal lesion, or hydronephrosis. Bladder is unremarkable. Stomach/Bowel: Stomach is within normal limits. Appendix appears normal. No evidence of bowel wall thickening, distention, or inflammatory changes. Vascular/Lymphatic: No significant vascular findings are present. No enlarged abdominal or pelvic lymph nodes. Reproductive: The prostate and seminal vesicles are grossly unremarkable. No pelvic mass Other: There is a small fat containing umbilical hernia. There is slightly thickened appearance of the umbilical skin and fat. Although this may be chronic. Correlation with clinical exam and point tenderness recommended to exclude and acute inflammatory process such as strangulated or incarcerated fat. Musculoskeletal: No acute or significant osseous findings. IMPRESSION: 1. No acute intra-abdominal or pelvic pathology. No bowel obstruction or active inflammation. Normal appendix. 2. Severe fatty liver. 3. Small fat containing umbilical hernia. Mild thickened appearance of the umbilical skin and fat may be chronic. Correlation with clinical exam and point tenderness recommended to exclude and acute inflammatory process such as strangulated or incarcerated fat. Electronically Signed   By: Ceasar MonsArash  Radparvar M.D.  On: 05/21/2018 03:18   Dg Abd 2 Views  Result Date: 05/19/2018 CLINICAL DATA:  Abdominal pain. Nausea and vomiting. Constipation. EXAM: ABDOMEN - 2 VIEW COMPARISON:  None. FINDINGS: The bowel gas pattern is normal. There is no evidence of free air. No radio-opaque calculi or other significant radiographic abnormality is seen. No excessive stool burden. No fecal impaction. IMPRESSION: Benign-appearing abdomen. Electronically Signed   By: Francene Boyers M.D.   On: 05/19/2018 14:17     Assessment/Plan Principal Problem:   DKA (diabetic ketoacidoses) (HCC) Active Problems:   ARF (acute renal failure)  (HCC)   Abdominal pain   Nausea & vomiting    1. Diabetic ketoacidosis -patient has been placed on IV fluids and IV insulin infusion.  Check hemoglobin A1c and follow metabolic panel.  Once anion gap is corrected change to subcutaneous insulin. 2. Renal failure likely acute -continue with hydration follow metabolic panel. 3. Elevated blood pressure -follow blood pressure trends. 4. Nausea vomiting with abdominal pain and CAT scan showing fat-containing umbilical hernia -abdominal exam appears benign.  Follow clinically. 5. Elevated LFTs with severe fatty liver -follow LFTs check acute hepatitis panel.  Further work-up as outpatient.  EKG and chest x-ray are pending.   DVT prophylaxis: Lovenox. Code Status: Full code. Family Communication: Discussed with patient.  Disposition Plan: Home. Consults called: None. Admission status: Observation.   Eduard Clos MD [facility] Pager [phone].  If 7PM-7AM, please contact night-coverage [email or web address] Password [facility]  05/21/2018, 3:53 AM

## 2018-05-21 NOTE — Progress Notes (Signed)
PROGRESS NOTE   Erik Harper  ZOX:[id number] DOB: Mar 27, 1996 DOA: 05/20/2018 PCP: Patient, No Pcp Per    Brief Narrative:  22 year old male who presented with abdominal pain, nausea, and vomiting.  He has been recently diagnosed with diabetes mellitus type 2 and placed on metformin.  Reported 7 days of persistent nausea and vomiting, he was diagnosed 3 days ago with diabetes, placed on metformin, medication that he was not able to take due to his GI symptoms.  On his initial physical examination blood pressure 148/108, heart rate 98, respirate 17, temperature 98.9, oxygen saturation 98%.  His lungs are clear to auscultation bilaterally, heart S1-S2 present rhythmic, abdomen soft nontender, no lower extremity edema.  Patient was admitted to the hospital with a working diagnosis of diabetes ketoacidosis.  Assessment & Plan:   Principal Problem:   DKA (diabetic ketoacidoses) (HCC) Active Problems:   ARF (acute renal failure) (HCC)   Abdominal pain   Nausea & vomiting   1. DKA. Will continue insulin infusion IV until closing of anion gap, patient reports improvement of nausea and vomiting, but note that has been NPO. Will continue bmp follow up q 4 hours.   2. AKI. Related to hypovolemia, will continue IV fluids with isotonic saline, follow on renal panel in am, avoid hypotension and nephrotoxic medications.   3, HTN. Will continue blood pressure monitoring, may need antihypertensive medications before discharge.   4. Elevated LFTs. Likely due to hypoperfusion, will continue supportive medical therapy and IV fluids, will follow liver panel in am.     DVT prophylaxis: enoxaparin   Code Status: full Family Communication: no family at the bedside  Disposition Plan/ discharge barriers: pending clinical improvement.   Body mass index is 41.56 kg/m. Malnutrition Type:      Malnutrition Characteristics:      Nutrition Interventions:     RN Pressure Injury Documentation:     Consultants:     Procedures:     Antimicrobials:       Subjective: Patient now on insulin drip, his nausea and vomiting have improved, not back to baseline, no chest pain, no dyspnea. Patient was not able to take metformin as outpatient, due to nausea and vomiting.   Objective: Vitals:   05/21/18 0845 05/21/18 0900 05/21/18 0915 05/21/18 1024  BP:    135/87  Pulse: (!) 110 (!) 114 (!) 114 (!) 118  Resp: 15 (!) 23 19 20   Temp:      SpO2: 97% 98% 98% 99%  Weight:      Height:        Intake/Output Summary (Last 24 hours) at 05/21/2018 1032 Last data filed at 05/21/2018 1025 Gross per 24 hour  Intake 1033.23 ml  Output 1200 ml  Net -166.77 ml   Filed Weights   05/20/18 2350  Weight: (!) 142.9 kg    Examination:   General: deconditioned and ill looking appearing  Neurology: Awake and alert, non focal  E ENT: positive pallor, no icterus, oral mucosa is dry.  Cardiovascular: No JVD. S1-S2 present, rhythmic, no gallops, rubs, or murmurs. No lower extremity edema. Pulmonary: positive breath sounds bilaterally, adequate air movement, no wheezing, rhonchi or rales. Gastrointestinal. Abdomen protuberant with no organomegaly, non tender, no rebound or guarding Skin. No rashes Musculoskeletal: no joint deformities     Data Reviewed: I have personally reviewed following labs and imaging studies  CBC: Recent Labs  Lab 05/20/18 2359 05/21/18 0406  WBC 15.6* 13.4*  HGB 15.8 15.3  HCT 50.3 48.5  MCV 84.3 84.8  PLT 383 358   Basic Metabolic Panel: Recent Labs  Lab 05/20/18 2359 05/21/18 0331 05/21/18 0904  NA 134* 132* 135  K 4.5 4.4 4.1  CL 99 99 106  CO2 12* 11* 10*  GLUCOSE 394* 354* 253*  BUN 9 8 7   CREATININE 1.47* 1.45* 1.18  CALCIUM 10.3 9.8 9.6   GFR: Estimated Creatinine Clearance: 147.2 mL/min (by C-G formula based on SCr of 1.18 mg/dL). Liver Function Tests: Recent Labs  Lab 05/20/18 2359  AST 48*  ALT 72*  ALKPHOS 77  BILITOT  1.9*  PROT 9.3*  ALBUMIN 4.8   Recent Labs  Lab 05/20/18 2359  LIPASE 47   No results for input(s): AMMONIA in the last 168 hours. Coagulation Profile: No results for input(s): INR, PROTIME in the last 168 hours. Cardiac Enzymes: Recent Labs  Lab 05/21/18 0406  TROPONINI <0.03   BNP (last 3 results) No results for input(s): PROBNP in the last 8760 hours. HbA1C: No results for input(s): HGBA1C in the last 72 hours. CBG: Recent Labs  Lab 05/21/18 0452 05/21/18 0602 05/21/18 0715 05/21/18 0901 05/21/18 0954  GLUCAP 298* 264* 239* 231* 236*   Lipid Profile: No results for input(s): CHOL, HDL, LDLCALC, TRIG, CHOLHDL, LDLDIRECT in the last 72 hours. Thyroid Function Tests: No results for input(s): TSH, T4TOTAL, FREET4, T3FREE, THYROIDAB in the last 72 hours. Anemia Panel: No results for input(s): VITAMINB12, FOLATE, FERRITIN, TIBC, IRON, RETICCTPCT in the last 72 hours.    Radiology Studies: I have reviewed all of the imaging during this hospital visit personally     Scheduled Meds: . enoxaparin (LOVENOX) injection  40 mg Subcutaneous Daily   Continuous Infusions: . sodium chloride 125 mL/hr at 05/21/18 0329  . sodium chloride    . dextrose 5 % and 0.45% NaCl 125 mL/hr at 05/21/18 0852  . insulin       LOS: 0 days        Coralie Keens, MD [facility] Pager [phone]

## 2018-05-21 NOTE — Progress Notes (Addendum)
Inpatient Diabetes Program Recommendations  AACE/ADA: New Consensus Statement on Inpatient Glycemic Control (2015)  Target Ranges:  Prepandial:   less than 140 mg/dL      Peak postprandial:   less than 180 mg/dL (1-2 hours)      Critically ill patients:  140 - 180 mg/dL   Lab Results  Component Value Date   GLUCAP 252 (H) 05/21/2018    Review of Glycemic Control  Diabetes history: DM2 Newly diagnosed 2 days ago @ urgent care 05/19/18 Outpatient Diabetes medications: Metformin 500 mg bid Current orders for Inpatient glycemic control: IV insulin drip  Inpatient Diabetes Program Recommendations:   Noted patient DKA on admission. -Hgb A1c -?Type 1 DM (?C-peptide)  When DKA resolved and meets criteria to D/C insulin drip, please give Lantus insulin 2 hrs prior to IV insulin drip discontinued and cover CBG when IV drip discontinued. -Lantus 0.2/kg x 142.9 kg = 28 units -May also need Novolog meal coverage when able to eat -Novolog correction scale sensitive tid + hs 0-5 units 2:45 pm Spoke with patient and RN Trinda PascalBrenda Michelson @ bedside. Patient states he was not able to start on the Metformin due to being sick on his stomach and unable to eat. Patient states he did not understand he had diabetes @ this visit. Patient complains of Nausea/vomiting, unquenchable thirst and polyuria over the past few weeks, increasing over the past 2 weeks.  Will follow while in the hospital.  Thank you, Erik FischerJudy E. Crystallynn Noorani, RN, MSN, CDE  Diabetes Coordinator Inpatient Glycemic Control Team Team Pager [phone] (8am-5pm) 05/21/2018 1:02 PM

## 2018-05-22 DIAGNOSIS — E1310 Other specified diabetes mellitus with ketoacidosis without coma: Secondary | ICD-10-CM | POA: Diagnosis not present

## 2018-05-22 DIAGNOSIS — R112 Nausea with vomiting, unspecified: Secondary | ICD-10-CM | POA: Diagnosis present

## 2018-05-22 DIAGNOSIS — F1290 Cannabis use, unspecified, uncomplicated: Secondary | ICD-10-CM | POA: Diagnosis present

## 2018-05-22 DIAGNOSIS — Z833 Family history of diabetes mellitus: Secondary | ICD-10-CM | POA: Diagnosis not present

## 2018-05-22 DIAGNOSIS — E1110 Type 2 diabetes mellitus with ketoacidosis without coma: Secondary | ICD-10-CM | POA: Diagnosis not present

## 2018-05-22 DIAGNOSIS — Z6841 Body Mass Index (BMI) 40.0 and over, adult: Secondary | ICD-10-CM | POA: Diagnosis not present

## 2018-05-22 DIAGNOSIS — I10 Essential (primary) hypertension: Secondary | ICD-10-CM | POA: Diagnosis present

## 2018-05-22 DIAGNOSIS — E0810 Diabetes mellitus due to underlying condition with ketoacidosis without coma: Secondary | ICD-10-CM | POA: Diagnosis not present

## 2018-05-22 DIAGNOSIS — E876 Hypokalemia: Secondary | ICD-10-CM | POA: Diagnosis present

## 2018-05-22 DIAGNOSIS — E861 Hypovolemia: Secondary | ICD-10-CM | POA: Diagnosis present

## 2018-05-22 DIAGNOSIS — N179 Acute kidney failure, unspecified: Secondary | ICD-10-CM | POA: Diagnosis present

## 2018-05-22 DIAGNOSIS — R7989 Other specified abnormal findings of blood chemistry: Secondary | ICD-10-CM | POA: Diagnosis present

## 2018-05-22 LAB — HEPATITIS PANEL, ACUTE
HCV Ab: <0.1 s/co ratio (ref 0.0–0.9)
Hep A IgM: NEGATIVE
Hep B C IgM: NEGATIVE
Hepatitis B Surface Ag: NEGATIVE

## 2018-05-22 LAB — GLUCOSE, CAPILLARY
GLUCOSE-CAPILLARY: 147 mg/dL — AB (ref 70–99)
GLUCOSE-CAPILLARY: 148 mg/dL — AB (ref 70–99)
Glucose-Capillary: 146 mg/dL — ABNORMAL HIGH (ref 70–99)
Glucose-Capillary: 147 mg/dL — ABNORMAL HIGH (ref 70–99)
Glucose-Capillary: 149 mg/dL — ABNORMAL HIGH (ref 70–99)
Glucose-Capillary: 153 mg/dL — ABNORMAL HIGH (ref 70–99)
Glucose-Capillary: 154 mg/dL — ABNORMAL HIGH (ref 70–99)
Glucose-Capillary: 155 mg/dL — ABNORMAL HIGH (ref 70–99)
Glucose-Capillary: 158 mg/dL — ABNORMAL HIGH (ref 70–99)
Glucose-Capillary: 166 mg/dL — ABNORMAL HIGH (ref 70–99)
Glucose-Capillary: 170 mg/dL — ABNORMAL HIGH (ref 70–99)
Glucose-Capillary: 171 mg/dL — ABNORMAL HIGH (ref 70–99)
Glucose-Capillary: 175 mg/dL — ABNORMAL HIGH (ref 70–99)
Glucose-Capillary: 209 mg/dL — ABNORMAL HIGH (ref 70–99)
Glucose-Capillary: 212 mg/dL — ABNORMAL HIGH (ref 70–99)
Glucose-Capillary: 233 mg/dL — ABNORMAL HIGH (ref 70–99)

## 2018-05-22 LAB — BASIC METABOLIC PANEL
Anion gap: 15 (ref 5–15)
Anion gap: 14 (ref 5–15)
Anion gap: 16 — ABNORMAL HIGH (ref 5–15)
BUN: 5 mg/dL — ABNORMAL LOW (ref 6–20)
BUN: <5 mg/dL — ABNORMAL LOW (ref 6–20)
BUN: <5 mg/dL — ABNORMAL LOW (ref 6–20)
CALCIUM: 9 mg/dL (ref 8.9–10.3)
CO2: 15 mmol/L — AB (ref 22–32)
CO2: 17 mmol/L — AB (ref 22–32)
CO2: 16 mmol/L — ABNORMAL LOW (ref 22–32)
CREATININE: 0.94 mg/dL (ref 0.61–1.24)
Calcium: 9.5 mg/dL (ref 8.9–10.3)
Calcium: 9.6 mg/dL (ref 8.9–10.3)
Chloride: 105 mmol/L (ref 98–111)
Chloride: 104 mmol/L (ref 98–111)
Chloride: 105 mmol/L (ref 98–111)
Creatinine, Ser: 1.12 mg/dL (ref 0.61–1.24)
Creatinine, Ser: 1.24 mg/dL (ref 0.61–1.24)
GFR calc Af Amer: >60 mL/min (ref >60)
GFR calc Af Amer: >60 mL/min (ref >60)
GFR calc non Af Amer: >60 mL/min (ref >60)
GFR calc non Af Amer: >60 mL/min (ref >60)
GFR calc non Af Amer: >60 mL/min (ref >60)
Glucose, Bld: 176 mg/dL — ABNORMAL HIGH (ref 70–99)
Glucose, Bld: 312 mg/dL — ABNORMAL HIGH (ref 70–99)
Glucose, Bld: 232 mg/dL — ABNORMAL HIGH (ref 70–99)
POTASSIUM: 3.4 mmol/L — AB (ref 3.5–5.1)
Potassium: 3.2 mmol/L — ABNORMAL LOW (ref 3.5–5.1)
Potassium: 2.8 mmol/L — ABNORMAL LOW (ref 3.5–5.1)
Sodium: 135 mmol/L (ref 135–145)
Sodium: 135 mmol/L (ref 135–145)
Sodium: 137 mmol/L (ref 135–145)

## 2018-05-22 MED ORDER — INSULIN GLARGINE 100 UNIT/ML ~~LOC~~ SOLN
20.0000 [IU] | Freq: Every day | SUBCUTANEOUS | Status: DC
  Administered 2018-05-22: 20 [IU] via SUBCUTANEOUS
  Filled 2018-05-22 (×2): qty 0.2

## 2018-05-22 MED ORDER — POTASSIUM CHLORIDE CRYS ER 20 MEQ PO TBCR
40.0000 meq | EXTENDED_RELEASE_TABLET | Freq: Once | ORAL | Status: AC
  Administered 2018-05-22: 40 meq via ORAL
  Filled 2018-05-22: qty 2

## 2018-05-22 MED ORDER — POTASSIUM CHLORIDE 10 MEQ/100ML IV SOLN
10.0000 meq | INTRAVENOUS | Status: AC
  Administered 2018-05-22 (×4): 10 meq via INTRAVENOUS
  Filled 2018-05-22 (×3): qty 100

## 2018-05-22 MED ORDER — INSULIN DETEMIR 100 UNIT/ML ~~LOC~~ SOLN: 20.0000 [IU] | Freq: Every day | SUBCUTANEOUS | Status: DC

## 2018-05-22 MED ORDER — LIVING WELL WITH DIABETES BOOK
Freq: Once | Status: AC
  Administered 2018-05-22: 14:00:00
  Filled 2018-05-22: qty 1

## 2018-05-22 MED ORDER — BUTALBITAL-APAP-CAFFEINE 50-325-40 MG PO TABS
2.0000 | ORAL_TABLET | Freq: Once | ORAL | Status: AC
  Administered 2018-05-22: 2 via ORAL
  Filled 2018-05-22: qty 2

## 2018-05-22 MED ORDER — INSULIN ASPART 100 UNIT/ML ~~LOC~~ SOLN
0.0000 [IU] | Freq: Three times a day (TID) | SUBCUTANEOUS | Status: DC
  Administered 2018-05-22 – 2018-05-23 (×2): 5 [IU] via SUBCUTANEOUS
  Administered 2018-05-23: 11 [IU] via SUBCUTANEOUS

## 2018-05-22 MED ORDER — POTASSIUM CHLORIDE CRYS ER 20 MEQ PO TBCR: 40.0000 meq | EXTENDED_RELEASE_TABLET | ORAL | Status: DC

## 2018-05-22 MED ORDER — PHENOL 1.4 % MT LIQD
1.0000 | OROMUCOSAL | Status: DC | PRN
  Administered 2018-05-23: 1 via OROMUCOSAL
  Filled 2018-05-22: qty 177

## 2018-05-22 MED ORDER — POTASSIUM CHLORIDE 10 MEQ/100ML IV SOLN
INTRAVENOUS | Status: AC
  Administered 2018-05-22: 10 meq via INTRAVENOUS
  Filled 2018-05-22: qty 100

## 2018-05-22 MED ORDER — ACETAMINOPHEN 325 MG PO TABS
650.0000 mg | ORAL_TABLET | ORAL | Status: DC | PRN
  Administered 2018-05-22: 650 mg via ORAL
  Filled 2018-05-22: qty 2

## 2018-05-22 NOTE — Progress Notes (Signed)
Spoke with patient about his diabetes. Was diagnosed just a few days ago. States that he was not taking his Metformin because he was sick and could not eat. He states that the Metformin was not making him sick. 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 He does not have a PCP locally. Will need to follow up with a physician at discharge. Case manager to see patient about his insurance and PCP.   Showed patient the insulin pen and how to use. Patient will need to practice more and will need further instruction on insulin dosages if patient is discharged on insulin. Health insurance will determine which insulin to use. If insurance does not cover insulin, the Novolin Relion insulin from [employer] would be an alternative. Patient states that he eats whenever he can, so 70/30 insulin would probably not be the best for him. He might need to take NPH insulin as a basal insulin.  Will continue to monitor blood sugars while in the hospital. Will follow to make sure he has discharge instruction.   Smith MinceKendra Ayub Kirsh RN BSN CDE Diabetes Coordinator Pager: [phone]  8am-5pm

## 2018-05-22 NOTE — Progress Notes (Signed)
PROGRESS NOTE    Jerremy Maione  WGN:[id number] DOB: 07-21-1995 DOA: 05/20/2018 PCP: Patient, No Pcp Per    Brief Narrative:  22 year old male who presented with abdominal pain, nausea, and vomiting.  He has been recently diagnosed with diabetes mellitus type 2 and placed on metformin.  Reported 7 days of persistent nausea and vomiting, he was diagnosed 3 days ago with diabetes, placed on metformin, medication that he was not able to take due to his GI symptoms.  On his initial physical examination blood pressure 148/108, heart rate 98, respirate 17, temperature 98.9, oxygen saturation 98%.  His lungs are clear to auscultation bilaterally, heart S1-S2 present rhythmic, abdomen soft nontender, no lower extremity edema.  Sodium 134, potassium 4.5, chloride 99, bicarb 12, glucose 394, BUN 9, creatinine 1.47, anion gap 23, white count 15.6, hemoglobin 15.8, Hct 50.3, platelets 333  Patient was admitted to the hospital with a working diagnosis of diabetes ketoacidosis.   Assessment & Plan:   Principal Problem:   DKA (diabetic ketoacidoses) (HCC) Active Problems:   ARF (acute renal failure) (HCC)   Abdominal pain   Nausea & vomiting  1. DKA. Patient with high insulin requirements per IV drip, will transition to sq insulin today now that the anion gap has closes. Patient is tolerating po, with no nausea or vomiting. 20 units insulin levimir and insulin sliding scale for glucose cover and monitoring, will follow on bmp this pm at 1600.   2. AKI with hypokalemia. Improved renal function with serum cr at 1,12. K at 2,8, this am at 40 meq IV, will add 40 meq more po of Kcl, and will follow on renal panel this am. Stop IV fluids and continue insulin therapy sq.   3, HTN. Will continue blood pressure monitoring.   4. Elevated LFTs. No episodes of hypotension, will hold on IV fluids and will follow on liver panel in am.     DVT prophylaxis: enoxaparin   Code Status: full Family  Communication: no family at the bedside  Disposition Plan/ discharge barriers: possible dc in am.   Body mass index is 42.88 kg/m. Malnutrition Type:      Malnutrition Characteristics:      Nutrition Interventions:     RN Pressure Injury Documentation:     Consultants:     Procedures:     Antimicrobials:       Subjective: Patient has remained with insulin drip overnight, this am, still on IV insulin his symptoms have improved, no nausea or vomiting, no chest pain or dyspnea.   Objective: Vitals:   05/21/18 1500 05/21/18 1603 05/21/18 1624 05/22/18 0800  BP: 120/78 137/84    Pulse: (!) 106 (!) 101    Resp: 18 (!) 22    Temp:  97.6 F (36.4 C)    TempSrc:  Oral    SpO2: 99% 98%  99%  Weight:   (!) 147.4 kg   Height:   6\' 1"  (1.854 m)     Intake/Output Summary (Last 24 hours) at 05/22/2018 1217 Last data filed at 05/22/2018 0800 Gross per 24 hour  Intake 2890.92 ml  Output 740 ml  Net 2150.92 ml   Filed Weights   05/20/18 2350 05/21/18 1624  Weight: (!) 142.9 kg (!) 147.4 kg    Examination:   General: deconditioned  Neurology: Awake and alert, non focal  E ENT: no pallor, no icterus, oral mucosa moist Cardiovascular: No JVD. S1-S2 present, rhythmic, no gallops, rubs, or murmurs. No lower extremity edema. Pulmonary:  vesicular breath sounds bilaterally, adequate air movement, no wheezing, rhonchi or rales. Gastrointestinal. Abdomen protuberant with no organomegaly, non tender, no rebound or guarding Skin. No rashes Musculoskeletal: no joint deformities     Data Reviewed: I have personally reviewed following labs and imaging studies  CBC: Recent Labs  Lab 05/20/18 2359 05/21/18 0406  WBC 15.6* 13.4*  HGB 15.8 15.3  HCT 50.3 48.5  MCV 84.3 84.8  PLT 383 358   Basic Metabolic Panel: Recent Labs  Lab 05/21/18 0904 05/21/18 1636 05/21/18 1947 05/22/18 0050 05/22/18 0416  NA 135 136 134* 135 135  K 4.1 3.5 3.2* 3.2* 2.8*  CL  106 106 105 105 104  CO2 10* 16* 17* 15* 17*  GLUCOSE 253* 210* 186* 176* 312*  BUN 7 6 <5* 5* <5*  CREATININE 1.18 1.13 1.01 0.94 1.12  CALCIUM 9.6 9.5 9.3 9.5 9.0   GFR: Estimated Creatinine Clearance: 157.8 mL/min (by C-G formula based on SCr of 1.12 mg/dL). Liver Function Tests: Recent Labs  Lab 05/20/18 2359  AST 48*  ALT 72*  ALKPHOS 77  BILITOT 1.9*  PROT 9.3*  ALBUMIN 4.8   Recent Labs  Lab 05/20/18 2359  LIPASE 47   No results for input(s): AMMONIA in the last 168 hours. Coagulation Profile: No results for input(s): INR, PROTIME in the last 168 hours. Cardiac Enzymes: Recent Labs  Lab 05/21/18 0406  TROPONINI <0.03   BNP (last 3 results) No results for input(s): PROBNP in the last 8760 hours. HbA1C: No results for input(s): HGBA1C in the last 72 hours. CBG: Recent Labs  Lab 05/22/18 0748 05/22/18 0850 05/22/18 0951 05/22/18 1055 05/22/18 1157  GLUCAP 147* 146* 153* 154* 147*   Lipid Profile: No results for input(s): CHOL, HDL, LDLCALC, TRIG, CHOLHDL, LDLDIRECT in the last 72 hours. Thyroid Function Tests: No results for input(s): TSH, T4TOTAL, FREET4, T3FREE, THYROIDAB in the last 72 hours. Anemia Panel: No results for input(s): VITAMINB12, FOLATE, FERRITIN, TIBC, IRON, RETICCTPCT in the last 72 hours.    Radiology Studies: I have reviewed all of the imaging during this hospital visit personally     Scheduled Meds: . enoxaparin (LOVENOX) injection  40 mg Subcutaneous Daily  . insulin aspart  0-15 Units Subcutaneous TID WC  . insulin glargine  20 Units Subcutaneous Daily  . potassium chloride  40 mEq Oral Q4H   Continuous Infusions: . famotidine (PEPCID) IV 20 mg (05/21/18 1823)  . insulin 7 Units/hr (05/22/18 1202)     LOS: 0 days        Treshawn Allen Annett Gulaaniel Alam Guterrez, MD [facility] Pager [phone]

## 2018-05-22 NOTE — Plan of Care (Signed)
Nutrition Education Note  RD consulted for nutrition education regarding diabetes.   Spoke with pt at bedside. Pt denies prior education regarding nutrition therapy for diabetes.  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 Pt reports typically eating 2 meals daily, one at noon and one in the evening around 8:00 pm. A meal may consist of a burger and fries or pasta. Pt shares that he drinks water, sweet tea, and juice throughout the day.  No results found for: HGBA1C  RD provided "Carbohydrate Counting for People with Diabetes" handout from the Academy of Nutrition and Dietetics. Discussed different food groups and their effects on blood sugar, emphasizing carbohydrate-containing foods. Provided list of carbohydrates and recommended serving sizes of common foods.  Discussed importance of controlled and consistent carbohydrate intake throughout the day. Provided examples of ways to balance meals/snacks and encouraged intake of high-fiber, whole grain complex carbohydrates. Teach back method used.  Expect fair compliance.  Body mass index is 42.88 kg/m. Pt meets criteria for obesity class III based on current BMI.  Current diet order is NPO, patient is consuming approximately N/A% of meals at this time. Labs and medications reviewed. No further nutrition interventions warranted at this time. RD contact information provided. If additional nutrition issues arise, please re-consult RD.   Earma ReadingKate Jablonski Kire Ferg, MS, RD, LDN Inpatient Clinical Dietitian Pager: [phone] Weekend/After Hours: [phone]

## 2018-05-22 NOTE — Plan of Care (Signed)

## 2018-05-23 DIAGNOSIS — N179 Acute kidney failure, unspecified: Secondary | ICD-10-CM

## 2018-05-23 DIAGNOSIS — E876 Hypokalemia: Secondary | ICD-10-CM

## 2018-05-23 DIAGNOSIS — E1310 Other specified diabetes mellitus with ketoacidosis without coma: Secondary | ICD-10-CM

## 2018-05-23 DIAGNOSIS — I10 Essential (primary) hypertension: Secondary | ICD-10-CM

## 2018-05-23 LAB — HEPATIC FUNCTION PANEL
ALT: 62 U/L — ABNORMAL HIGH (ref 0–44)
AST: 46 U/L — ABNORMAL HIGH (ref 15–41)
Albumin: 3.8 g/dL (ref 3.5–5.0)
Alkaline Phosphatase: 60 U/L (ref 38–126)
Bilirubin, Direct: 0.3 mg/dL — ABNORMAL HIGH (ref 0.0–0.2)
Indirect Bilirubin: 1.3 mg/dL — ABNORMAL HIGH (ref 0.3–0.9)
Total Bilirubin: 1.6 mg/dL — ABNORMAL HIGH (ref 0.3–1.2)
Total Protein: 7.1 g/dL (ref 6.5–8.1)

## 2018-05-23 LAB — BASIC METABOLIC PANEL
Anion gap: 16 — ABNORMAL HIGH (ref 5–15)
BUN: 5 mg/dL — ABNORMAL LOW (ref 6–20)
CALCIUM: 9.5 mg/dL (ref 8.9–10.3)
CO2: 21 mmol/L — ABNORMAL LOW (ref 22–32)
Chloride: 99 mmol/L (ref 98–111)
Creatinine, Ser: 1.04 mg/dL (ref 0.61–1.24)
GFR calc Af Amer: >60 mL/min (ref >60)
GFR calc non Af Amer: >60 mL/min (ref >60)
Glucose, Bld: 275 mg/dL — ABNORMAL HIGH (ref 70–99)
Potassium: 3.3 mmol/L — ABNORMAL LOW (ref 3.5–5.1)
Sodium: 136 mmol/L (ref 135–145)

## 2018-05-23 LAB — GLUCOSE, CAPILLARY
Glucose-Capillary: 244 mg/dL — ABNORMAL HIGH (ref 70–99)
Glucose-Capillary: 341 mg/dL — ABNORMAL HIGH (ref 70–99)

## 2018-05-23 MED ORDER — INSULIN ASPART 100 UNIT/ML ~~LOC~~ SOLN: SUBCUTANEOUS | 0 refills | Status: DC

## 2018-05-23 MED ORDER — POTASSIUM CHLORIDE CRYS ER 20 MEQ PO TBCR
40.0000 meq | EXTENDED_RELEASE_TABLET | Freq: Once | ORAL | Status: AC
  Administered 2018-05-23: 40 meq via ORAL
  Filled 2018-05-23: qty 2

## 2018-05-23 MED ORDER — BLOOD GLUCOSE MONITOR KIT: PACK | 1 refills | Status: DC

## 2018-05-23 MED ORDER — INSULIN GLARGINE 100 UNIT/ML ~~LOC~~ SOLN
30.0000 [IU] | Freq: Every day | SUBCUTANEOUS | Status: DC
  Administered 2018-05-23: 30 [IU] via SUBCUTANEOUS
  Filled 2018-05-23: qty 0.3

## 2018-05-23 MED ORDER — LISINOPRIL 10 MG PO TABS: 5.0000 mg | ORAL_TABLET | Freq: Every day | ORAL | 0 refills | Status: DC

## 2018-05-23 MED ORDER — "INSULIN SYRINGE-NEEDLE U-100 25G X 1"" 1 ML MISC": 0 refills | Status: DC

## 2018-05-23 MED ORDER — INSULIN GLARGINE 100 UNIT/ML ~~LOC~~ SOLN: 40.0000 [IU] | Freq: Every day | SUBCUTANEOUS | 0 refills | Status: DC

## 2018-05-23 MED ORDER — INSULIN GLARGINE 100 UNIT/ML ~~LOC~~ SOLN
10.0000 [IU] | Freq: Once | SUBCUTANEOUS | Status: AC
  Administered 2018-05-23: 10 [IU] via SUBCUTANEOUS
  Filled 2018-05-23: qty 0.1

## 2018-05-23 MED ORDER — LISINOPRIL 10 MG PO TABS
10.0000 mg | ORAL_TABLET | Freq: Every day | ORAL | Status: DC
  Administered 2018-05-23: 10 mg via ORAL
  Filled 2018-05-23: qty 1

## 2018-05-23 MED ORDER — INSULIN GLARGINE 100 UNIT/ML ~~LOC~~ SOLN: 30.0000 [IU] | Freq: Every day | SUBCUTANEOUS | 0 refills | Status: DC

## 2018-05-23 MED ORDER — MAGNESIUM SULFATE IN D5W 1-5 GM/100ML-% IV SOLN
1.0000 g | Freq: Once | INTRAVENOUS | Status: AC
  Administered 2018-05-23: 1 g via INTRAVENOUS
  Filled 2018-05-23: qty 100

## 2018-05-23 MED FILL — !TRUE METRIX BLOOD GLUCOSE: 365 days supply | Qty: 1 | Fill #0

## 2018-05-23 MED FILL — LANTUS 100 UNITS/ML VIAL: 100 | 25 days supply | Qty: 10 | Fill #0

## 2018-05-23 MED FILL — TRUEplus LANCETS 28G MISC: 25 days supply | Qty: 100 | Fill #0

## 2018-05-23 MED FILL — LISINOPRIL 10 MG TABS: 10 | 30 days supply | Qty: 15 | Fill #0

## 2018-05-23 MED FILL — TRUE METRIX TEST STRIP: 25 days supply | Qty: 100 | Fill #0

## 2018-05-23 MED FILL — NovoLOG 100 UNIT/ML SOLN: 100 | 28 days supply | Qty: 10 | Fill #0

## 2018-05-23 NOTE — Discharge Instructions (Signed)
Follow with Primary MD in 4 days   Get CBC, CMP checked  by Primary MD  in 4 days   Activity: As tolerated with Full fall precautions use walker/cane & assistance as needed  Disposition Home    Diet: Low Carb  Accuchecks 4 times/day, Once in AM empty stomach and then before each meal. Log in all results and show them to your Prim.MD in 3 days. If any glucose reading is under 80 or above 300 call your Prim MD immidiately. Follow Low glucose instructions for glucose under 80 as instructed.  Special Instructions: If you have smoked or chewed Tobacco  in the last 2 yrs please stop smoking, stop any regular Alcohol  and or any Recreational drug use.  On your next visit with your primary care physician please Get Medicines reviewed and adjusted.  Please request your Prim.MD to go over all Hospital Tests and Procedure/Radiological results at the follow up, please get all Hospital records sent to your Prim MD by signing hospital release before you go home.  If you experience worsening of your admission symptoms, develop shortness of breath, life threatening emergency, suicidal or homicidal thoughts you must seek medical attention immediately by calling 911 or calling your MD immediately  if symptoms less severe.  You Must read complete instructions/literature along with all the possible adverse reactions/side effects for all the Medicines you take and that have been prescribed to you. Take any new Medicines after you have completely understood and accpet all the possible adverse reactions/side effects.

## 2018-05-23 NOTE — Care Management (Signed)
#    1.   [facility]  @ [facility] # [phone]   [place] PEN COVER- YES CO-PAY- $ 3.00 PRIOR APPROVAL- YES  # [phone]  PREFERRED PHARMACY : YES - [facility]  PHARMACY BENEFITS DON'T APPLY TO OUT OF STATE

## 2018-05-23 NOTE — Discharge Summary (Addendum)
Erik Harper SHF:[id number] DOB: 20-Jul-1995 DOA: 05/20/2018  PCP: Patient, No Pcp Per  Admit date: 05/20/2018  Discharge date: 05/23/2018  Admitted From: Home   Disposition:  Home   Recommendations for Outpatient Follow-up:   Follow up with PCP in 1-2 weeks  PCP Please obtain BMP/CBC, 2 view CXR in 1week,  (see Discharge instructions)   PCP Please follow up on the following pending results:    Home Health: None  Equipment/Devices: None  Consultations: Dm Educator Discharge Condition: Stable   CODE STATUS: Full   Diet Recommendation: Low Carb  Chief Complaint  Patient presents with  . Abdominal Pain  . Constipation     Brief history of present illness from the day of admission and additional interim summary    Erik Harper is a 22 y.o. male with recently diagnosed diabetes mellitus type 2 3 days ago placed on metformin which patient has not taken because of persistent nausea vomiting presents to the ER.    Work-up was consistent with DKA and he was admitted to the hospital.                                                                 Hospital Course   1.  DKA in a patient with recently diagnosed DM type II who was on metformin and not compliant with it.  Treated with DKA protocol with IV fluids and IV insulin, DKA has resolved, electrolytes are stable, he will receive diabetic and insulin education, he will be placed on Lantus along with sliding scale in addition to his Glucophage.  Counseled on compliance.  Testing supplies also provided.  Recommended one-time outpatient endocrine follow-up with Dr. Loanne Drilling.  2. HTN - placed on ACE - PCP to monitor  3. Hypokalemia - replaced.  4. Morbid Obesity - follow with PCP and Endo.       Discharge diagnosis     Principal Problem:   DKA (diabetic  ketoacidoses) ([place]) Active Problems:   ARF (acute renal failure) (H[facility])   Abdominal pain   Nausea & vomiting    Discharge instructions    Discharge Instructions    Discharge instructions   Complete by:  As directed    Substitute to any brand approved.   Increase activity slowly   Complete by:  As directed       Discharge Medications   Allergies as of 05/23/2018   No Known Allergies     Medication List    TAKE these medications   blood glucose meter kit and supplies Kit Dispense based on patient and insurance preference. Use up to four times daily as directed. (FOR ICD-9 250.00, 250.01). For QAC - HS accuchecks.   insulin aspart 100 UNIT/ML injection Commonly known as:  NOVOLOG Substitute to any brand approved.Before each meal 3 times  a day, 140-199 - 2 units, 200-250 - 6 units, 251-299 - 8 units,  300-349 - 10 units,  350 or above 12 units. Dispense syringes and needles as needed, Ok to switch to PEN if approved. DX DM2, Code E11.65   insulin glargine 100 UNIT/ML injection Commonly known as:  LANTUS Inject 0.4 mLs (40 Units total) into the skin at bedtime. Dispense insulin pen if approved, if not dispense as needed syringes and needles for 1 month supply. Can switch to Levemir. Diagnosis E 11.65.   Insulin Syringe-Needle U-100 25G X 1" 1 ML Misc For 4 times a day insulin SQ, 1 month supply. Diagnosis E11.65   lisinopril 10 MG tablet Commonly known as:  PRINIVIL,ZESTRIL Take 0.5 tablets (5 mg total) by mouth daily.   metFORMIN 500 MG tablet Commonly known as:  GLUCOPHAGE Take 1 tablet (500 mg total) by mouth 2 (two) times daily with a meal.       Follow-up Information    [place]. Go on 06/06/2018.   Why:  8:30 am, Dr. Asencion Noble Contact information: [place] [id number] [phone]       Renato Shin, MD. Schedule an appointment as soon as possible for a visit in 1 week(s).     Specialty:  Endocrinology Why:  DM1 Contact information: [address] [place] [address] [phone]           Major procedures and Radiology Reports - PLEASE review detailed and final reports thoroughly  -       Ct Abdomen Pelvis W Contrast  Result Date: 05/21/2018 CLINICAL DATA:  22 year old male with nausea and vomiting. EXAM: CT ABDOMEN AND PELVIS WITH CONTRAST TECHNIQUE: Multidetector CT imaging of the abdomen and pelvis was performed using the standard protocol following bolus administration of intravenous contrast. CONTRAST:  110m OMNIPAQUE IOHEXOL 300 MG/ML  SOLN COMPARISON:  Abdominal radiograph dated 05/19/2018 FINDINGS: Lower chest: The visualized lung bases are clear. No intra-abdominal free air or free fluid. Hepatobiliary: Severe diffuse fatty infiltration of the liver. No intrahepatic biliary ductal dilatation. The gallbladder is unremarkable. Pancreas: Unremarkable. No pancreatic ductal dilatation or surrounding inflammatory changes. Spleen: Apparent ill-defined 15 mm low attenuating area in the spleen anteriorly, likely artifactual and related to timing of the contrast. The spleen is otherwise unremarkable. Adrenals/Urinary Tract: Adrenal glands are unremarkable. Kidneys are normal, without renal calculi, focal lesion, or hydronephrosis. Bladder is unremarkable. Stomach/Bowel: Stomach is within normal limits. Appendix appears normal. No evidence of bowel wall thickening, distention, or inflammatory changes. Vascular/Lymphatic: No significant vascular findings are present. No enlarged abdominal or pelvic lymph nodes. Reproductive: The prostate and seminal vesicles are grossly unremarkable. No pelvic mass Other: There is a small fat containing umbilical hernia. There is slightly thickened appearance of the umbilical skin and fat. Although this may be chronic. Correlation with clinical exam and point tenderness recommended to exclude and acute inflammatory  process such as strangulated or incarcerated fat. Musculoskeletal: No acute or significant osseous findings. IMPRESSION: 1. No acute intra-abdominal or pelvic pathology. No bowel obstruction or active inflammation. Normal appendix. 2. Severe fatty liver. 3. Small fat containing umbilical hernia. Mild thickened appearance of the umbilical skin and fat may be chronic. Correlation with clinical exam and point tenderness recommended to exclude and acute inflammatory process such as strangulated or incarcerated fat. Electronically Signed   By: AAnner CreteM.D.   On: 05/21/2018 03:18   [facility]  Result Date: 05/21/2018 CLINICAL DATA:  22 year old male with epigastric pain. EXAM: PORTABLE CHEST 1 VIEW COMPARISON:  None. FINDINGS: The heart size and mediastinal contours are within normal limits. Both lungs are clear. The visualized skeletal structures are unremarkable. IMPRESSION: No active disease. Electronically Signed   By: Anner Crete M.D.   On: 05/21/2018 06:11   Dg Abd 2 Views  Result Date: 05/19/2018 CLINICAL DATA:  Abdominal pain. Nausea and vomiting. Constipation. EXAM: ABDOMEN - 2 VIEW COMPARISON:  None. FINDINGS: The bowel gas pattern is normal. There is no evidence of free air. No radio-opaque calculi or other significant radiographic abnormality is seen. No excessive stool burden. No fecal impaction. IMPRESSION: Benign-appearing abdomen. Electronically Signed   By: Lorriane Shire M.D.   On: 05/19/2018 14:17    Micro Results     Recent Results (from the past 240 hour(s))  MRSA PCR Screening     Status: None   Collection Time: 05/21/18  4:05 PM  Result Value Ref Range Status   MRSA by PCR NEGATIVE NEGATIVE Final    Comment:        The GeneXpert MRSA Assay (FDA approved for NASAL specimens only), is one component of a comprehensive MRSA colonization surveillance program. It is not intended to diagnose MRSA infection nor to guide or monitor treatment for MRSA  infections. Performed at [facility], [place] [address], [place], [place] [address]     Today   Subjective    Erik Harper today has no headache,no chest abdominal pain,no new weakness tingling or numbness, feels much better wants to go home today.     Objective   Blood pressure (!) 147/98, pulse 94, temperature 98.9 F (37.2 C), temperature source Oral, resp. rate (!) 21, height '6\' 1"'  (1.854 m), weight (!) 147.4 kg, SpO2 100 %.   Intake/Output Summary (Last 24 hours) at 05/23/2018 1303 Last data filed at 05/23/2018 0520 Gross per 24 hour  Intake 447.02 ml  Output 2050 ml  Net -1602.98 ml    Exam  Awake Alert, Oriented x 3, No new F.N deficits, Normal affect [place].AT,PERRAL Supple Neck,No JVD, No cervical lymphadenopathy appriciated.  Symmetrical Chest wall movement, Good air movement bilaterally, CTAB RRR,No Gallops,Rubs or new Murmurs, No Parasternal Heave +ve B.Sounds, Abd Soft, Non tender, No organomegaly appriciated, No rebound -guarding or rigidity. No Cyanosis, Clubbing or edema, No new Rash or bruise   Data Review   CBC w Diff:  Lab Results  Component Value Date   WBC 13.4 (H) 05/21/2018   HGB 15.3 05/21/2018   HCT 48.5 05/21/2018   PLT 358 05/21/2018    CMP:  Lab Results  Component Value Date   NA 136 05/23/2018   K 3.3 (L) 05/23/2018   CL 99 05/23/2018   CO2 21 (L) 05/23/2018   BUN 5 (L) 05/23/2018   CREATININE 1.04 05/23/2018   PROT 7.1 05/23/2018   ALBUMIN 3.8 05/23/2018   BILITOT 1.6 (H) 05/23/2018   ALKPHOS 60 05/23/2018   AST 46 (H) 05/23/2018   ALT 62 (H) 05/23/2018   CBG (last 3)  Recent Labs    05/22/18 2124 05/23/18 0751 05/23/18 1224  GLUCAP 233* 244* 341*    Total Time in preparing paper work, data evaluation and todays exam - 23 minutes  Lala Lund M.D on 05/23/2018 at 1:03 PM  [facility]   Office  [phone]

## 2018-05-23 NOTE — Progress Notes (Signed)
Talked with patient about his discharge orders for insulin. Went over the orders for both Lantus and Novolog. Given insulin pen starter kit with instructions. Reviewed low blood sugar, symptoms,treatment, and symptoms of hyperglycemia. Patient stated that he had missed 3 final exams at school, so the charge nurse wrote a letter that patient had been in hospital 12/10-12/13 and could return to school on 12/14. Letter given to patient so that he can submit to the school. Spoke with Levada Dy, case manager, about discharge planning and patient being able to get his insulin at [facility].   Harvel Ricks RN BSN CDE Diabetes Coordinator Pager: [phone]  8am-5pm

## 2018-05-23 NOTE — Progress Notes (Signed)
Erik Harper to be D/C'd to home per MD order.  Discussed with the patient and all questions fully answered.  VSS, Skin clean, dry and intact without evidence of skin break down, no evidence of skin tears noted. IV catheter discontinued intact. Site without signs and symptoms of complications. Dressing and pressure applied.  An After Visit Summary was printed and given to the patient. Patient received prescriptions.  D/c education completed with patient/family including follow up instructions, medication list, d/c activities limitations if indicated, with other d/c instructions as indicated by MD - patient able to verbalize understanding, all questions fully answered.   Patient instructed to return to ED, call 911, or call MD for any changes in condition.   Patient escorted via WC, and D/C home via private auto.  [name] Jonathan Kirkendoll 05/23/2018 1:40 PM

## 2018-05-23 NOTE — Care Management Note (Signed)
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              Pt's cell# [phone] / Ms.Venning (mom) ,[phone]  Action/Plan: Transition to home. Match Letter given to assist with medication needs. Pt [facility] which was faxed per NCM.  Pt to f/u with [facility] FOR HOSPIITAL F/U, noted on AVS. Friend to provide transportation.   Expected Discharge Date:  05/23/18               Expected Discharge Plan:  Home/Self Care  In-House Referral:     Discharge planning Services  CM Consult, [facility]igent [facility] Clinic, Follow-up appt scheduled, MATCH Program(Pt without insurance coverage for [place]. Pt student in college.)  Post Acute Care Choice:  NA Choice offered to:  NA  DME Arranged:  N/A DME Agency:  NA  HH Arranged:  NA HH Agency:  NA  Status of Service:  Completed, signed off  If discussed at Long Length of Stay Meetings, dates discussed:    Additional Comments:  Epifanio LeschesCole, Ellaina Schuler Hudson, RN 05/23/2018, 10:38 AM

## 2018-05-23 NOTE — Progress Notes (Signed)
Notified MD of CBG at lunch. Verbal order for additional 4 units of insulin.

## 2018-06-06 ENCOUNTER — Inpatient Hospital Stay: Payer: PRIVATE HEALTH INSURANCE | Admitting: Critical Care Medicine

## 2018-06-24 ENCOUNTER — Ambulatory Visit (INDEPENDENT_AMBULATORY_CARE_PROVIDER_SITE_OTHER): Payer: PRIVATE HEALTH INSURANCE | Admitting: Endocrinology

## 2018-06-24 ENCOUNTER — Encounter: Payer: Self-pay | Admitting: Endocrinology

## 2018-06-24 VITALS — BP 132/84 | HR 83 | Ht 73.0 in | Wt 338.2 lb

## 2018-06-24 DIAGNOSIS — E0810 Diabetes mellitus due to underlying condition with ketoacidosis without coma: Secondary | ICD-10-CM

## 2018-06-24 LAB — POCT GLYCOSYLATED HEMOGLOBIN (HGB A1C): Hemoglobin A1C: 6.9 % — AB (ref 4.0–5.6)

## 2018-06-24 MED ORDER — INSULIN GLARGINE 100 UNIT/ML SOLOSTAR PEN: 30.0000 [IU] | PEN_INJECTOR | SUBCUTANEOUS | 11 refills | Status: DC

## 2018-06-24 MED ORDER — "INSULIN SYRINGE-NEEDLE U-100 25G X 1"" 1 ML MISC": 1.0000 | Freq: Four times a day (QID) | 11 refills | Status: DC

## 2018-06-24 NOTE — Patient Instructions (Addendum)
good diet and exercise significantly improve the control of your diabetes.  please let me know if you wish to be referred to a dietician.  high blood sugar is very risky to your health.  you should see an eye doctor and dentist every year.  It is very important to get all recommended vaccinations.  Controlling your blood pressure and cholesterol drastically reduces the damage diabetes does to your body.  Those who smoke should quit.  Please discuss these with your doctor.  check your blood sugar once a day.  vary the time of day when you check, between before the 3 meals, and at bedtime.  also check if you have symptoms of your blood sugar being too high or too low.  please keep a record of the readings and bring it to your next appointment here (or you can bring the meter itself).  You can write it on any piece of paper.  please call us sooner if your blood sugar goes below 70, or if you have a lot of readings over 200. For now, please:  Stay off the metformin and novolog, and:  Take lantus, 30 units each morning.  Please come back for a follow-up appointment in 2 months.

## 2018-06-24 NOTE — Progress Notes (Signed)
Subjective:    Patient ID: Erik Harper, male    DOB: 22-Jul-1995, 22 y.o.   MRN: [id number]  HPI pt is referred by Dr Thedore MinsSingh, for diabetes.  Pt states DM was dx'ed in late 2019; he has mild if any neuropathy of the lower extremities; he is unaware of any associated chronic complications; he has been on insulin since soon after dx; pt says his diet and exercise are improved recently; he has never had pancreatitis, pancreatic surgery, or severe hypoglycemia.  He had DKA soon after dx.  He takes lantus, 30/d, and PRN novolog (seldom takes).  He says cbg's are in the 90's-100's. It is in general higher as the day goes on  Past Medical History:  Diagnosis Date  . DKA (diabetic ketoacidoses) (HCC) 05/2018   NEW DIABETES TYPE 2    Past Surgical History:  Procedure Laterality Date  . TONSILLECTOMY      Social History   Socioeconomic History  . Marital status: Single    Spouse name: Not on file  . Number of children: Not on file  . Years of education: Not on file  . Highest education level: Not on file  Occupational History  . Not on file  Social Needs  . Financial resource strain: Not on file  . Food insecurity:    Worry: Not on file    Inability: Not on file  . Transportation needs:    Medical: Not on file    Non-medical: Not on file  Tobacco Use  . Smoking status: Never Smoker  . Smokeless tobacco: Never Used  Substance and Sexual Activity  . Alcohol use: Yes    Comment: rarely  . Drug use: No    Comment: PT reports marijuana use in the past  . Sexual activity: Not on file  Lifestyle  . Physical activity:    Days per week: Not on file    Minutes per session: Not on file  . Stress: Not on file  Relationships  . Social connections:    Talks on phone: Not on file    Gets together: Not on file    Attends religious service: Not on file    Active member of club or organization: Not on file    Attends meetings of clubs or organizations: Not on file    Relationship  status: Not on file  . Intimate partner violence:    Fear of current or ex partner: Not on file    Emotionally abused: Not on file    Physically abused: Not on file    Forced sexual activity: Not on file  Other Topics Concern  . Not on file  Social History Narrative  . Not on file    Current Outpatient Medications on File Prior to Visit  Medication Sig Dispense Refill  . insulin glargine (LANTUS) 100 UNIT/ML injection Inject 0.4 mLs (40 Units total) into the skin at bedtime. Dispense insulin pen if approved, if not dispense as needed syringes and needles for 1 month supply. Can switch to Levemir. Diagnosis E 11.65. 10 mL 0  . lisinopril (PRINIVIL,ZESTRIL) 10 MG tablet Take 0.5 tablets (5 mg total) by mouth daily. 30 tablet 0   No current facility-administered medications on file prior to visit.     No Known Allergies  Family History  Problem Relation Age of Onset  . Diabetes Mellitus II Mother     BP 132/84 (BP Location: Right Arm, Patient Position: Sitting, Cuff Size: Large)   Pulse 83  Ht 6\' 1"  (1.854 m)   Wt (!) 338 lb 3.2 oz (153.4 kg)   SpO2 97%   BMI 44.62 kg/m   Review of Systems denies blurry vision, headache, chest pain, sob, n/v, urinary frequency, muscle cramps, excessive diaphoresis, memory loss, depression, cold intolerance, rhinorrhea, and easy bruising.  He has lost an uncertain amount of weight.       Objective:   Physical Exam VS: see vs page GEN: no distress HEAD: head: no deformity eyes: no periorbital swelling, no proptosis external nose and ears are normal mouth: no lesion seen NECK: supple, thyroid is not enlarged CHEST WALL: no deformity LUNGS: clear to auscultation CV: reg rate and rhythm, no murmur ABD: abdomen is soft, nontender.  no hepatosplenomegaly.  not distended.  no hernia MUSCULOSKELETAL: muscle bulk and strength are grossly normal.  no obvious joint swelling.  gait is normal and steady EXTEMITIES: no deformity.  no ulcer on the  feet.  feet are of normal color and temp.  Trace bilat leg edema PULSES: dorsalis pedis intact bilat.  no carotid bruit NEURO:  cn 2-12 grossly intact.   readily moves all 4's.  sensation is intact to touch on the feet SKIN:  Normal texture and temperature.  No rash or suspicious lesion is visible.  Acanthosis at the intertrigenous areas.  NODES:  None palpable at the neck PSYCH: alert, well-oriented.  Does not appear anxious nor depressed.    Lab Results  Component Value Date   HGBA1C 6.9 (A) 06/24/2018   Lab Results  Component Value Date   CREATININE 1.04 05/23/2018   BUN 5 (L) 05/23/2018   NA 136 05/23/2018   K 3.3 (L) 05/23/2018   CL 99 05/23/2018   CO2 21 (L) 05/23/2018   I have reviewed outside records, and summarized: Pt was noted to have elevated a1c, and referred here.  He was seen for new onset DM, and was rx'ed metformin, but he did not take.  He presented with DKA a few days later.      Assessment & Plan:  Type 1 DM, new to me: I advised him to continue multiple daily injections, but he declines to continue novolog.    Patient Instructions  good diet and exercise significantly improve the control of your diabetes.  please let me know if you wish to be referred to a dietician.  high blood sugar is very risky to your health.  you should see an eye doctor and dentist every year.  It is very important to get all recommended vaccinations.  Controlling your blood pressure and cholesterol drastically reduces the damage diabetes does to your body.  Those who smoke should quit.  Please discuss these with your doctor.  check your blood sugar once a day.  vary the time of day when you check, between before the 3 meals, and at bedtime.  also check if you have symptoms of your blood sugar being too high or too low.  please keep a record of the readings and bring it to your next appointment here (or you can bring the meter itself).  You can write it on any piece of paper.  please call us  sooner if your blood sugar goes below 70, or if you have a lot of readings over 200. For now, please:  Stay off the metformin and novolog, and:  Take lantus, 30 units each morning.  Please come back for a follow-up appointment in 2 months.

## 2018-08-26 ENCOUNTER — Telehealth: Payer: Self-pay | Admitting: Endocrinology

## 2018-08-26 ENCOUNTER — Ambulatory Visit: Payer: PRIVATE HEALTH INSURANCE | Admitting: Endocrinology

## 2018-08-26 NOTE — Telephone Encounter (Signed)
Please schedule f/u appt for next available appointment  

## 2018-08-26 NOTE — Telephone Encounter (Signed)
Patient no showed today's appt. Please advise on how to follow up. °A. No follow up necessary. °B. Follow up urgent. Contact patient immediately. °C. Follow up necessary. Contact patient and schedule visit in ___ days. °D. Follow up advised. Contact patient and schedule visit in ____weeks. ° °Would you like the NS fee to be applied to this visit? ° °

## 2018-08-27 NOTE — Telephone Encounter (Signed)
Called patient to reschedule missed appointment. Per patient, patient will call back later to reschedule missed appointment.

## 2018-08-27 NOTE — Telephone Encounter (Signed)
Please refer to Dr. Ellison's response 

## 2019-02-10 ENCOUNTER — Telehealth: Payer: Self-pay

## 2019-02-10 NOTE — Telephone Encounter (Signed)
Company: [employer]  Document: Records request Other records requested: Office notes "INCLUDING B/P" from 06/24/18 and A1C from 06/24/18  All above requested information has been faxed successfully to the Company listed above. Documents and fax confirmation have been placed in the faxed file for future reference.

## 2019-11-26 IMAGING — CT CT ABD-PELV W/ CM
2 of 4 series · 16 of 46 positions shown, 18 images · IV contrast (omnipaque)
Comparison: Abdominal radiograph dated 05/19/2018

CLINICAL DATA: 22-year-old male with nausea and vomiting.

EXAM:
CT ABDOMEN AND PELVIS WITH CONTRAST
TECHNIQUE: Multidetector CT imaging of the abdomen and pelvis was performed
using the standard protocol following bolus administration of
intravenous contrast.
CONTRAST:  100mL OMNIPAQUE IOHEXOL 300 MG/ML  SOLN

[Series 3: a/p w/ 5mm · axial · 0.98mm/px · z∈[+744,+1239]mm · 13 of 109 slices shown, 15 images]
[im 5/109  soft-tissue]
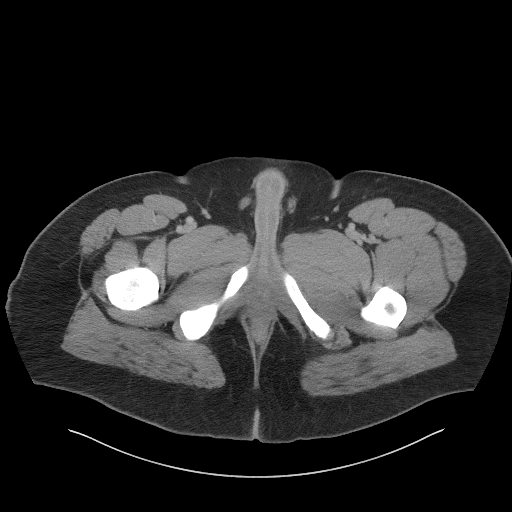
[im 5/109  bone]
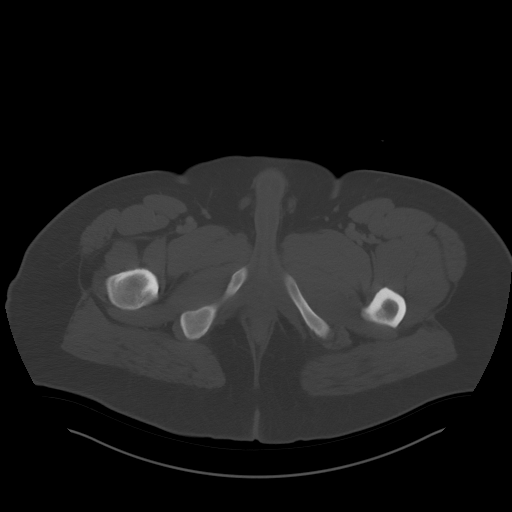
[im 15/109  soft-tissue]
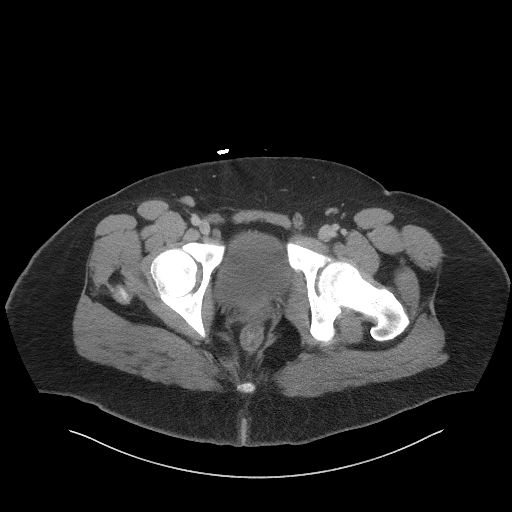
[im 24/109  soft-tissue]
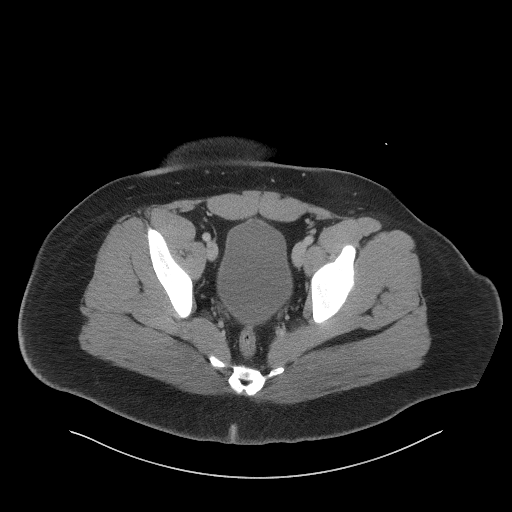
[im 29/109  soft-tissue]
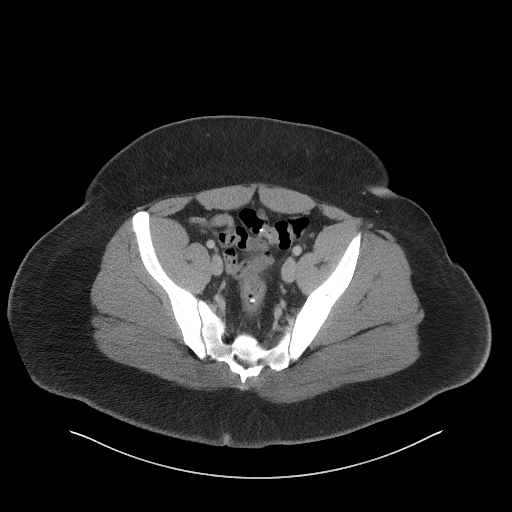
[im 38/109  soft-tissue]
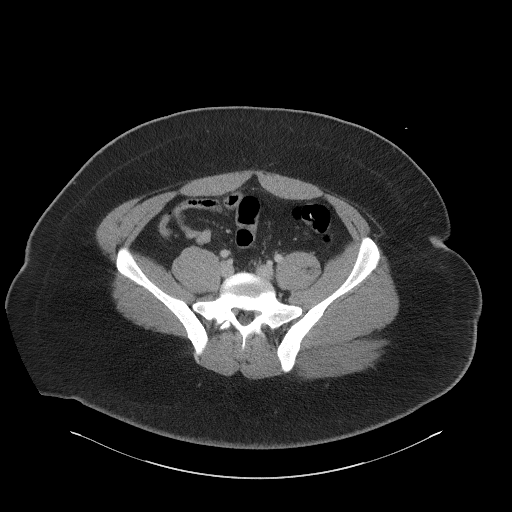
[im 47/109  soft-tissue]
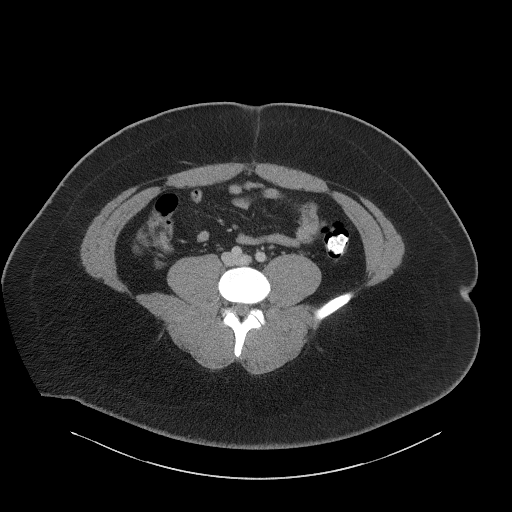
[im 57/109  soft-tissue]
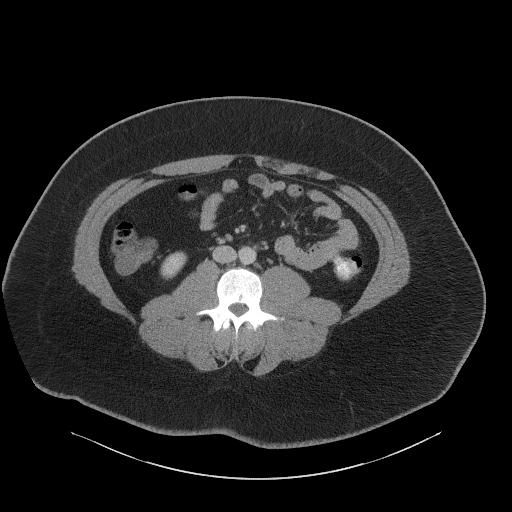
[im 62/109  soft-tissue]
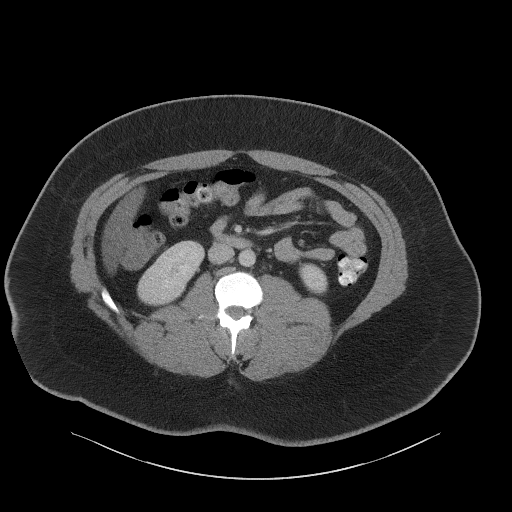
[im 71/109  soft-tissue]
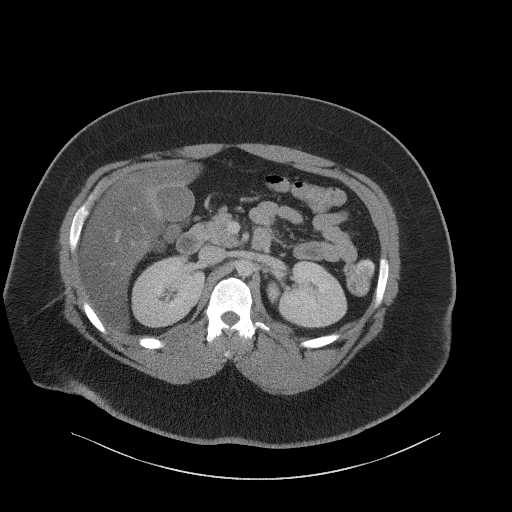
[im 71/109  bone]
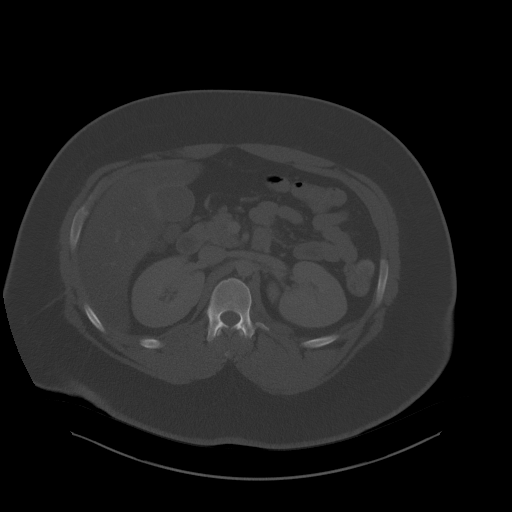
[im 80/109  soft-tissue]
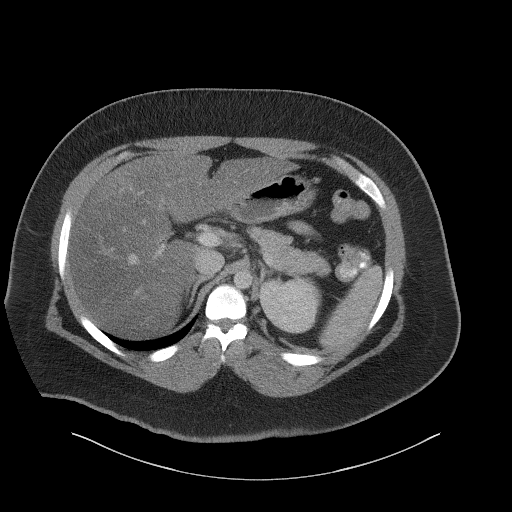
[im 85/109  soft-tissue]
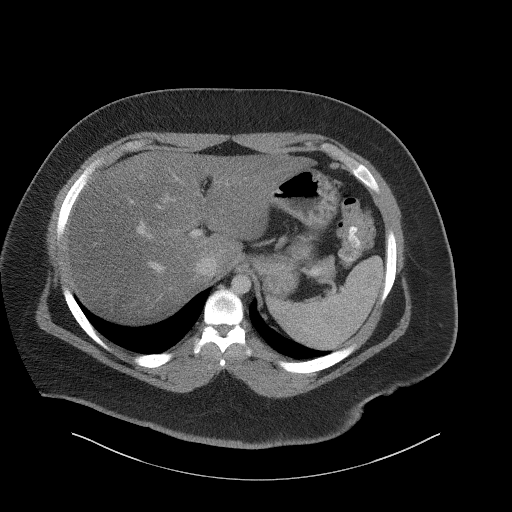
[im 94/109  soft-tissue]
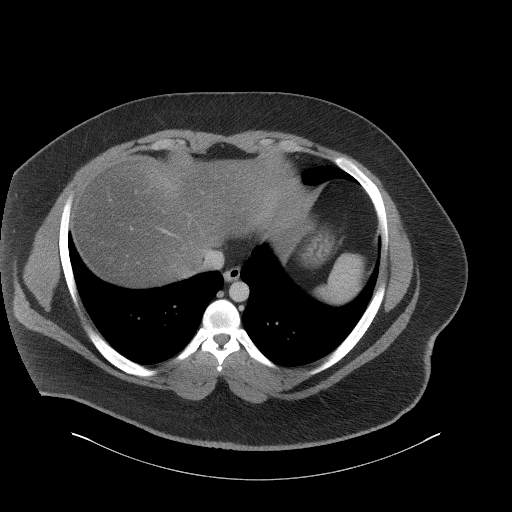
[im 104/109  soft-tissue]
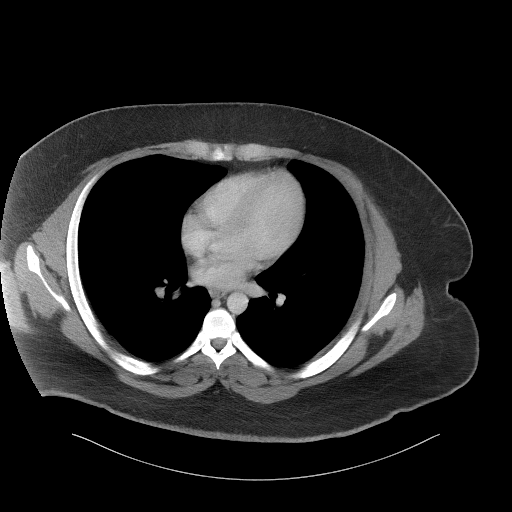

[Series 6: a/p w/ cor · coronal · 0.98mm/px · 3 of 146 slices shown]
[im 49/146  soft-tissue]
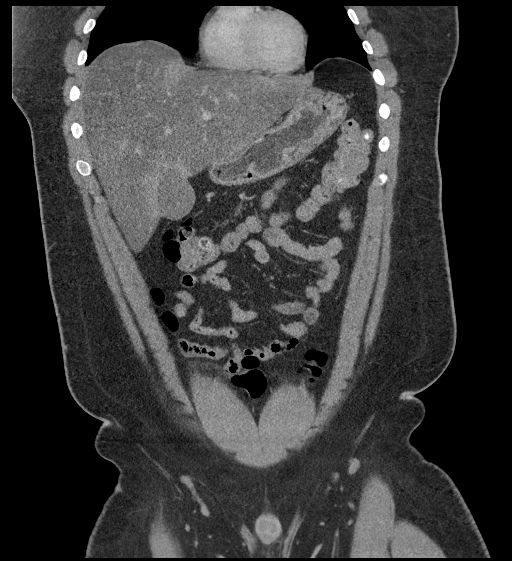
[im 65/146  soft-tissue]
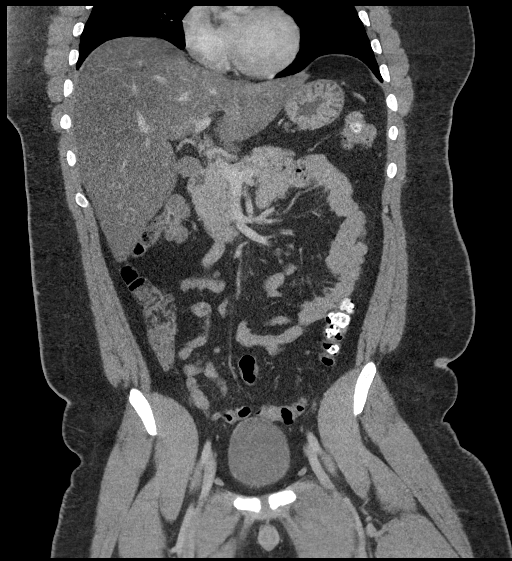
[im 81/146  soft-tissue]
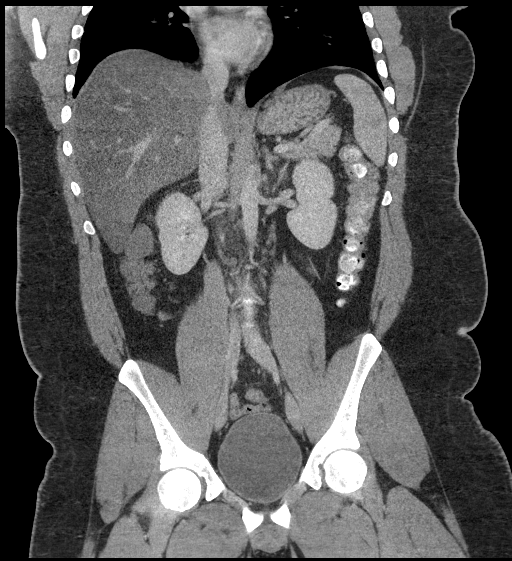

[16 of 46 positions shown; findings below may reference images not displayed]

FINDINGS: Lower chest: The visualized lung bases are clear.

No intra-abdominal free air or free fluid.

Hepatobiliary: Severe diffuse fatty infiltration of the liver. No
intrahepatic biliary ductal dilatation. The gallbladder is
unremarkable.

Pancreas: Unremarkable. No pancreatic ductal dilatation or
surrounding inflammatory changes.

Spleen: Apparent ill-defined 15 mm low attenuating area in the
spleen anteriorly, likely artifactual and related to timing of the
contrast. The spleen is otherwise unremarkable.

Adrenals/Urinary Tract: Adrenal glands are unremarkable. Kidneys are
normal, without renal calculi, focal lesion, or hydronephrosis.
Bladder is unremarkable.

Stomach/Bowel: Stomach is within normal limits. Appendix appears
normal. No evidence of bowel wall thickening, distention, or
inflammatory changes.

Vascular/Lymphatic: No significant vascular findings are present. No
enlarged abdominal or pelvic lymph nodes.

Reproductive: The prostate and seminal vesicles are grossly
unremarkable. No pelvic mass

Other: There is a small fat containing umbilical hernia. There is
slightly thickened appearance of the umbilical skin and fat.
Although this may be chronic. Correlation with clinical exam and
point tenderness recommended to exclude and acute inflammatory
process such as strangulated or incarcerated fat.

Musculoskeletal: No acute or significant osseous findings.
IMPRESSION: 1. No acute intra-abdominal or pelvic pathology. No bowel
obstruction or active inflammation. Normal appendix.
2. Severe fatty liver.
3. Small fat containing umbilical hernia. Mild thickened appearance
of the umbilical skin and fat may be chronic. Correlation with
clinical exam and point tenderness recommended to exclude and acute
inflammatory process such as strangulated or incarcerated fat.

## 2020-08-19 ENCOUNTER — Other Ambulatory Visit: Payer: Self-pay

## 2020-08-19 ENCOUNTER — Encounter (HOSPITAL_COMMUNITY): Payer: Self-pay | Admitting: Emergency Medicine

## 2020-08-19 ENCOUNTER — Inpatient Hospital Stay (HOSPITAL_COMMUNITY)
Admission: EM | Admit: 2020-08-19 | Discharge: 2020-08-21 | DRG: 638 | Disposition: A | Discharge status: Home / Self Care | Payer: PRIVATE HEALTH INSURANCE | Attending: Internal Medicine | Admitting: Internal Medicine

## 2020-08-19 DIAGNOSIS — E871 Hypo-osmolality and hyponatremia: Secondary | ICD-10-CM | POA: Diagnosis present

## 2020-08-19 DIAGNOSIS — Z833 Family history of diabetes mellitus: Secondary | ICD-10-CM | POA: Diagnosis not present

## 2020-08-19 DIAGNOSIS — L83 Acanthosis nigricans: Secondary | ICD-10-CM | POA: Diagnosis present

## 2020-08-19 DIAGNOSIS — I10 Essential (primary) hypertension: Secondary | ICD-10-CM | POA: Diagnosis present

## 2020-08-19 DIAGNOSIS — Z20822 Contact with and (suspected) exposure to covid-19: Secondary | ICD-10-CM | POA: Diagnosis present

## 2020-08-19 DIAGNOSIS — F1210 Cannabis abuse, uncomplicated: Secondary | ICD-10-CM | POA: Diagnosis present

## 2020-08-19 DIAGNOSIS — E876 Hypokalemia: Secondary | ICD-10-CM | POA: Diagnosis present

## 2020-08-19 DIAGNOSIS — Z9114 Patient's other noncompliance with medication regimen: Secondary | ICD-10-CM | POA: Diagnosis not present

## 2020-08-19 DIAGNOSIS — E1010 Type 1 diabetes mellitus with ketoacidosis without coma: Secondary | ICD-10-CM | POA: Diagnosis present

## 2020-08-19 DIAGNOSIS — N179 Acute kidney failure, unspecified: Secondary | ICD-10-CM | POA: Diagnosis present

## 2020-08-19 DIAGNOSIS — R112 Nausea with vomiting, unspecified: Secondary | ICD-10-CM

## 2020-08-19 DIAGNOSIS — E1110 Type 2 diabetes mellitus with ketoacidosis without coma: Principal | ICD-10-CM | POA: Diagnosis present

## 2020-08-19 DIAGNOSIS — E0810 Diabetes mellitus due to underlying condition with ketoacidosis without coma: Secondary | ICD-10-CM | POA: Diagnosis not present

## 2020-08-19 LAB — I-STAT VENOUS BLOOD GAS, ED
Acid-base deficit: 9 mmol/L — ABNORMAL HIGH (ref 0.0–2.0)
Bicarbonate: 16.4 mmol/L — ABNORMAL LOW (ref 20.0–28.0)
Calcium, Ion: 1.23 mmol/L (ref 1.15–1.40)
HCT: 46 % (ref 39.0–52.0)
Hemoglobin: 15.6 g/dL (ref 13.0–17.0)
O2 Saturation: 54 %
Potassium: 4 mmol/L (ref 3.5–5.1)
Sodium: 138 mmol/L (ref 135–145)
TCO2: 17 mmol/L — ABNORMAL LOW (ref 22–32)
pCO2, Ven: 33.9 mmHg — ABNORMAL LOW (ref 44.0–60.0)
pH, Ven: 7.293 (ref 7.250–7.430)
pO2, Ven: 31 mmHg — CL (ref 32.0–45.0)

## 2020-08-19 LAB — URINALYSIS, ROUTINE W REFLEX MICROSCOPIC
Bacteria, UA: NONE SEEN
Bilirubin Urine: NEGATIVE
Glucose, UA: >=500 mg/dL — AB
Ketones, ur: 80 mg/dL — AB
Leukocytes,Ua: NEGATIVE
Nitrite: NEGATIVE
Protein, ur: 30 mg/dL — AB
Specific Gravity, Urine: 1.028 (ref 1.005–1.030)
pH: 5 (ref 5.0–8.0)

## 2020-08-19 LAB — HIV ANTIBODY (ROUTINE TESTING W REFLEX): HIV Screen 4th Generation wRfx: NONREACTIVE

## 2020-08-19 LAB — COMPREHENSIVE METABOLIC PANEL
ALT: 94 U/L — ABNORMAL HIGH (ref 0–44)
AST: 58 U/L — ABNORMAL HIGH (ref 15–41)
Albumin: 4.7 g/dL (ref 3.5–5.0)
Alkaline Phosphatase: 80 U/L (ref 38–126)
Anion gap: 19 — ABNORMAL HIGH (ref 5–15)
BUN: 9 mg/dL (ref 6–20)
CO2: 14 mmol/L — ABNORMAL LOW (ref 22–32)
Calcium: 10.3 mg/dL (ref 8.9–10.3)
Chloride: 100 mmol/L (ref 98–111)
Creatinine, Ser: 1.55 mg/dL — ABNORMAL HIGH (ref 0.61–1.24)
GFR, Estimated: >60 mL/min (ref >60)
Glucose, Bld: 443 mg/dL — ABNORMAL HIGH (ref 70–99)
Potassium: 3.6 mmol/L (ref 3.5–5.1)
Sodium: 133 mmol/L — ABNORMAL LOW (ref 135–145)
Total Bilirubin: 2.7 mg/dL — ABNORMAL HIGH (ref 0.3–1.2)
Total Protein: 8.7 g/dL — ABNORMAL HIGH (ref 6.5–8.1)

## 2020-08-19 LAB — LIPASE, BLOOD: Lipase: 32 U/L (ref 11–51)

## 2020-08-19 LAB — CBC
HCT: 47.3 % (ref 39.0–52.0)
Hemoglobin: 16.5 g/dL (ref 13.0–17.0)
MCH: 28.8 pg (ref 26.0–34.0)
MCHC: 34.9 g/dL (ref 30.0–36.0)
MCV: 82.7 fL (ref 80.0–100.0)
Platelets: 345 10*3/uL (ref 150–400)
RBC: 5.72 MIL/uL (ref 4.22–5.81)
RDW: 12.5 % (ref 11.5–15.5)
WBC: 15.7 10*3/uL — ABNORMAL HIGH (ref 4.0–10.5)
nRBC: 0 % (ref 0.0–0.2)

## 2020-08-19 LAB — BASIC METABOLIC PANEL
Anion gap: 17 — ABNORMAL HIGH (ref 5–15)
BUN: 8 mg/dL (ref 6–20)
CO2: 15 mmol/L — ABNORMAL LOW (ref 22–32)
Calcium: 9.7 mg/dL (ref 8.9–10.3)
Chloride: 102 mmol/L (ref 98–111)
Creatinine, Ser: 1.19 mg/dL (ref 0.61–1.24)
GFR, Estimated: >60 mL/min (ref >60)
Glucose, Bld: 261 mg/dL — ABNORMAL HIGH (ref 70–99)
Potassium: 3.7 mmol/L (ref 3.5–5.1)
Sodium: 134 mmol/L — ABNORMAL LOW (ref 135–145)

## 2020-08-19 LAB — CBG MONITORING, ED
Glucose-Capillary: 453 mg/dL — ABNORMAL HIGH (ref 70–99)
Glucose-Capillary: 346 mg/dL — ABNORMAL HIGH (ref 70–99)
Glucose-Capillary: 243 mg/dL — ABNORMAL HIGH (ref 70–99)
Glucose-Capillary: 232 mg/dL — ABNORMAL HIGH (ref 70–99)
Glucose-Capillary: 195 mg/dL — ABNORMAL HIGH (ref 70–99)

## 2020-08-19 LAB — HEMOGLOBIN A1C
Hgb A1c MFr Bld: 11.3 % — ABNORMAL HIGH (ref 4.8–5.6)
Mean Plasma Glucose: 277.61 mg/dL

## 2020-08-19 LAB — BETA-HYDROXYBUTYRIC ACID: Beta-Hydroxybutyric Acid: 7.28 mmol/L — ABNORMAL HIGH (ref 0.05–0.27)

## 2020-08-19 LAB — GLUCOSE, CAPILLARY: Glucose-Capillary: 185 mg/dL — ABNORMAL HIGH (ref 70–99)

## 2020-08-19 MED ORDER — DEXTROSE IN LACTATED RINGERS 5 % IV SOLN: INTRAVENOUS | Status: DC

## 2020-08-19 MED ORDER — POTASSIUM CHLORIDE 10 MEQ/100ML IV SOLN
10.0000 meq | Freq: Once | INTRAVENOUS | Status: AC
  Administered 2020-08-19: 10 meq via INTRAVENOUS

## 2020-08-19 MED ORDER — LACTATED RINGERS IV BOLUS
2000.0000 mL | Freq: Once | INTRAVENOUS | Status: AC
  Administered 2020-08-19: 2000 mL via INTRAVENOUS

## 2020-08-19 MED ORDER — POTASSIUM CHLORIDE 10 MEQ/100ML IV SOLN: 10.0000 meq | INTRAVENOUS | Status: DC

## 2020-08-19 MED ORDER — LACTATED RINGERS IV SOLN: INTRAVENOUS | Status: DC

## 2020-08-19 MED ORDER — DEXTROSE 50 % IV SOLN: 0.0000 mL | INTRAVENOUS | Status: DC | PRN

## 2020-08-19 MED ORDER — INSULIN REGULAR(HUMAN) IN NACL 100-0.9 UT/100ML-% IV SOLN
INTRAVENOUS | Status: DC
  Administered 2020-08-19: 9 [IU]/h via INTRAVENOUS
  Filled 2020-08-19: qty 100

## 2020-08-19 MED ORDER — LACTATED RINGERS IV BOLUS: 20.0000 mL/kg | Freq: Once | INTRAVENOUS | Status: DC

## 2020-08-19 MED ORDER — DEXTROSE IN LACTATED RINGERS 5 % IV SOLN
INTRAVENOUS | Status: DC
Start: 2020-08-19 — End: 2020-08-20

## 2020-08-19 MED ORDER — ENOXAPARIN SODIUM 80 MG/0.8ML ~~LOC~~ SOLN
70.0000 mg | SUBCUTANEOUS | Status: DC
  Administered 2020-08-19: 70 mg via SUBCUTANEOUS
  Filled 2020-08-19: qty 0.7

## 2020-08-19 MED ORDER — LACTATED RINGERS IV BOLUS
1000.0000 mL | Freq: Once | INTRAVENOUS | Status: AC
  Administered 2020-08-19: 1000 mL via INTRAVENOUS

## 2020-08-19 MED ORDER — ONDANSETRON HCL 4 MG/2ML IJ SOLN
4.0000 mg | Freq: Once | INTRAMUSCULAR | Status: AC
  Administered 2020-08-19: 4 mg via INTRAVENOUS
  Filled 2020-08-19: qty 2

## 2020-08-19 MED ORDER — INSULIN REGULAR(HUMAN) IN NACL 100-0.9 UT/100ML-% IV SOLN
INTRAVENOUS | Status: DC
  Administered 2020-08-19: 15 [IU]/h via INTRAVENOUS
  Filled 2020-08-19: qty 100

## 2020-08-19 MED ORDER — POTASSIUM CHLORIDE 10 MEQ/100ML IV SOLN
10.0000 meq | INTRAVENOUS | Status: AC
  Administered 2020-08-19: 10 meq via INTRAVENOUS
  Filled 2020-08-19 (×2): qty 100

## 2020-08-19 NOTE — ED Triage Notes (Signed)
Patient here for evaluation of generalized weakness and nausea that started a few weeks ago, patient believes symptoms were brought on by daily cannabis use. Patient stopped using cannabis on Monday. HR in triage 144, patient denies chest pain and shortness of breath. Patient alert, oriented, and in no apparent distress at this time.

## 2020-08-19 NOTE — ED Notes (Signed)
Attempted report x1. 

## 2020-08-19 NOTE — H&P (Signed)
History and Physical   Erik Harper AXE:[id number] DOB: 05-19-1996 DOA: 08/19/2020  Referring MD/NP/PA: Dr. Silverio Lay  PCP: Patient, No Pcp Per   Outpatient Specialists: None  Patient coming from: Home  Chief Complaint: Weakness with nausea vomiting  HPI: Erik Harper is a 25 y.o. male with medical history significant of type 1 diabetes and hypertension as well as morbid obesity who has not taken his medications almost 2 years.  Per patient his blood sugar improved after exercise and he thought he does not need to take his medications.  He came to the ER where he was found to have significant hyperglycemia, elevated anion gap and ketoacidosis.  Patient is now being admitted with diabetic ketoacidosis..  ED Course: Temperature is 99.9 blood pressure 156/121, pulse 144 respiratory rate of 35 oxygen sat 97% on room air.  White count 15.7.  Sodium 134 potassium 3.7 chloride 102.  CO2 is 15.  His gap was 19 with creatinine 1.55 and glucose of 443.  Is positive ketones.  Patient being admitted for diabetic ketoacidosis.  Review of Systems: As per HPI otherwise 10 point review of systems negative.    Past Medical History:  Diagnosis Date  . DKA (diabetic ketoacidoses) 05/2018   NEW DIABETES TYPE 2    Past Surgical History:  Procedure Laterality Date  . TONSILLECTOMY       reports that he has never smoked. He has never used smokeless tobacco. He reports current alcohol use. He reports that he does not use drugs.  No Known Allergies  Family History  Problem Relation Age of Onset  . Diabetes Mellitus II Mother      Prior to Admission medications   Medication Sig Start Date End Date Taking? Authorizing Provider  Insulin Glargine (LANTUS SOLOSTAR) 100 UNIT/ML Solostar Pen Inject 30 Units into the skin every morning. Patient not taking: Reported on 08/19/2020 06/24/18   Romero Belling, MD  insulin glargine (LANTUS) 100 UNIT/ML injection Inject 0.4 mLs (40 Units total) into the skin at  bedtime. Dispense insulin pen if approved, if not dispense as needed syringes and needles for 1 month supply. Can switch to Levemir. Diagnosis E 11.65. Patient not taking: Reported on 08/19/2020 05/23/18   Leroy Sea, MD  lisinopril (PRINIVIL,ZESTRIL) 10 MG tablet Take 0.5 tablets (5 mg total) by mouth daily. Patient not taking: Reported on 08/19/2020 05/23/18 06/22/18  Leroy Sea, MD    Physical Exam: Vitals:   08/19/20 1830 08/19/20 2000 08/19/20 2130 08/19/20 2252  BP: (!) 143/61 (!) 146/78 (!) 121/98 (!) 155/99  Pulse: (!) 108 (!) 123 (!) 125 (!) 110  Resp: 15 20 20 18   Temp:    98.2 F (36.8 C)  TempSrc:    Oral  SpO2: 100% 98% 98% 99%  Weight:  (!) 142.9 kg  (!) 143.3 kg  Height:  6' (1.829 m)  6' (1.829 m)      Constitutional: Morbidly obese, no distress Vitals:   08/19/20 1830 08/19/20 2000 08/19/20 2130 08/19/20 2252  BP: (!) 143/61 (!) 146/78 (!) 121/98 (!) 155/99  Pulse: (!) 108 (!) 123 (!) 125 (!) 110  Resp: 15 20 20 18   Temp:    98.2 F (36.8 C)  TempSrc:    Oral  SpO2: 100% 98% 98% 99%  Weight:  (!) 142.9 kg  (!) 143.3 kg  Height:  6' (1.829 m)  6' (1.829 m)   Eyes: PERRL, lids and conjunctivae normal ENMT: Mucous membranes are dry. Posterior pharynx clear of any  exudate or lesions.Normal dentition.  Neck: normal, supple, no masses, no thyromegaly Respiratory: clear to auscultation bilaterally, no wheezing, no crackles. Normal respiratory effort. No accessory muscle use.  Cardiovascular: Sinus tachycardia, no murmurs / rubs / gallops. No extremity edema. 2+ pedal pulses. No carotid bruits.  Abdomen: no tenderness, no masses palpated. No hepatosplenomegaly. Bowel sounds positive.  Musculoskeletal: no clubbing / cyanosis. No joint deformity upper and lower extremities. Good ROM, no contractures. Normal muscle tone.  Skin: no rashes, lesions, ulcers. No induration Neurologic: CN 2-12 grossly intact. Sensation intact, DTR normal. Strength 5/5 in all 4.   Psychiatric: Normal judgment and insight. Alert and oriented x 3. Normal mood.     Labs on Admission: I have personally reviewed following labs and imaging studies  CBC: Recent Labs  Lab 08/19/20 1638 08/19/20 1818  WBC 15.7*  --   HGB 16.5 15.6  HCT 47.3 46.0  MCV 82.7  --   PLT 345  --    Basic Metabolic Panel: Recent Labs  Lab 08/19/20 1638 08/19/20 1818 08/19/20 1915  NA 133* 138 134*  K 3.6 4.0 3.7  CL 100  --  102  CO2 14*  --  15*  GLUCOSE 443*  --  261*  BUN 9  --  8  CREATININE 1.55*  --  1.19  CALCIUM 10.3  --  9.7   GFR: Estimated Creatinine Clearance: 141.9 mL/min (by C-G formula based on SCr of 1.19 mg/dL). Liver Function Tests: Recent Labs  Lab 08/19/20 1638  AST 58*  ALT 94*  ALKPHOS 80  BILITOT 2.7*  PROT 8.7*  ALBUMIN 4.7   Recent Labs  Lab 08/19/20 1638  LIPASE 32   No results for input(s): AMMONIA in the last 168 hours. Coagulation Profile: No results for input(s): INR, PROTIME in the last 168 hours. Cardiac Enzymes: No results for input(s): CKTOTAL, CKMB, CKMBINDEX, TROPONINI in the last 168 hours. BNP (last 3 results) No results for input(s): PROBNP in the last 8760 hours. HbA1C: Recent Labs    08/19/20 1915  HGBA1C 11.3*   CBG: Recent Labs  Lab 08/19/20 2001 08/19/20 2121 08/19/20 2233 08/19/20 2258 08/20/20 0019  GLUCAP 243* 232* 195* 185* 165*   Lipid Profile: No results for input(s): CHOL, HDL, LDLCALC, TRIG, CHOLHDL, LDLDIRECT in the last 72 hours. Thyroid Function Tests: No results for input(s): TSH, T4TOTAL, FREET4, T3FREE, THYROIDAB in the last 72 hours. Anemia Panel: No results for input(s): VITAMINB12, FOLATE, FERRITIN, TIBC, IRON, RETICCTPCT in the last 72 hours. Urine analysis:    Component Value Date/Time   COLORURINE YELLOW 08/19/2020 1832   APPEARANCEUR CLEAR 08/19/2020 1832   LABSPEC 1.028 08/19/2020 1832   PHURINE 5.0 08/19/2020 1832   GLUCOSEU >=500 (A) 08/19/2020 1832   HGBUR SMALL (A)  08/19/2020 1832   BILIRUBINUR NEGATIVE 08/19/2020 1832   KETONESUR 80 (A) 08/19/2020 1832   PROTEINUR 30 (A) 08/19/2020 1832   NITRITE NEGATIVE 08/19/2020 1832   LEUKOCYTESUR NEGATIVE 08/19/2020 1832   Sepsis Labs: @LABRCNTIP (procalcitonin:4,lacticidven:4) )No results found for this or any previous visit (from the past 240 hour(s)).   Radiological Exams on Admission: No results found.    Assessment/Plan Principal Problem:   DKA (diabetic ketoacidosis) (HCC) Active Problems:   ARF (acute renal failure) (HCC)   Nausea & vomiting   Cannabis abuse     #1 diabetic ketoacidosis: Patient will be admitted.  Stat DKA protocol with IV insulin.  Monitor blood glucose every 1 hour and BMP every 4 hours.  Transition  and titrate down to subcutaneous insulin.  Counseling provided about patient's use of medicines.  #2 acute kidney injury: Most likely prerenal.  Continue to monitor.  #3 cannabis abuse: Stable.  Counseling provided.  #4 essential hypertension: Patient also stopped taking his antihypertensives.  We will follow closely and restart if needed.   DVT prophylaxis: Lovenox Code Status: Full code Family Communication: Significant other in the room Disposition Plan: Home Consults called: None Admission status: Inpatient  Severity of Illness: The appropriate patient status for this patient is INPATIENT. Inpatient status is judged to be reasonable and necessary in order to provide the required intensity of service to ensure the patient's safety. The patient's presenting symptoms, physical exam findings, and initial radiographic and laboratory data in the context of their chronic comorbidities is felt to place them at high risk for further clinical deterioration. Furthermore, it is not anticipated that the patient will be medically stable for discharge from the hospital within 2 midnights of admission. The following factors support the patient status of inpatient.   " The patient's  presenting symptoms include nausea with vomiting. " The worrisome physical exam findings include dry mucous membrane. " The initial radiographic and laboratory data are worrisome because of evidence of DKA. " The chronic co-morbidities include diabetes.   * I certify that at the point of admission it is my clinical judgment that the patient will require inpatient hospital care spanning beyond 2 midnights from the point of admission due to high intensity of service, high risk for further deterioration and high frequency of surveillance required.Lonia Blood MD [facility] Pager [phone]  If 7PM-7AM, please contact night-coverage [email or web address] Password [facility]  08/20/2020, 12:42 AM

## 2020-08-19 NOTE — ED Provider Notes (Signed)
[facility] EMERGENCY DEPARTMENT Provider Note   CSN: [id number] Arrival date & time: 08/19/20  1524     History Chief Complaint  Patient presents with  . Weakness    Erik Harper is a 25 y.o. male past medical history of type 2 diabetes, presenting to the emergency department with complaint of about 2 weeks generalized weakness nausea, poor appetite.  He states his symptoms are worse in the mornings, he has quite a bit of dry heaving and upset stomach.  He has been having vomiting.  He has been trying his best to hydrate orally.  He does endorse increased thirst and polyuria.  Only occasional abdominal pains.  No fevers or URI symptoms.  States he thinks his symptoms are due to cannabinoid induced hyperemesis.  States he is a frequent marijuana user, 2-3 times daily.  Once he searched on Google his symptoms, he discontinued his marijuana use on Monday.  Regarding his history of diabetes, he has not been treating outpatient.  He states his blood sugars were in the 80s when he checked them therefore stopped his medications.  He has not seen PCP either.  Has not checked his blood sugar in multiple months though believes his diabetes is well controlled.  Per review of medical record, patient was diagnosed with type 2 diabetes in December 2019 at urgent care.  He was prescribed Metformin, however presented to the ED 2 days later in DKA, requiring admission.  The history is provided by the patient and medical records.       Past Medical History:  Diagnosis Date  . DKA (diabetic ketoacidoses) 05/2018   NEW DIABETES TYPE 2    Patient Active Problem List   Diagnosis Date Noted  . Cannabis abuse 08/19/2020  . DKA (diabetic ketoacidosis) (HCC) 05/21/2018  . ARF (acute renal failure) (HCC) 05/21/2018  . Abdominal pain 05/21/2018  . Nausea & vomiting 05/21/2018    Past Surgical History:  Procedure Laterality Date  . TONSILLECTOMY         Family History  Problem  Relation Age of Onset  . Diabetes Mellitus II Mother     Social History   Tobacco Use  . Smoking status: Never Smoker  . Smokeless tobacco: Never Used  Vaping Use  . Vaping Use: Never used  Substance Use Topics  . Alcohol use: Yes    Comment: rarely  . Drug use: No    Comment: PT reports marijuana use in the past    Home Medications Prior to Admission medications   Medication Sig Start Date End Date Taking? Authorizing Provider  Insulin Glargine (LANTUS SOLOSTAR) 100 UNIT/ML Solostar Pen Inject 30 Units into the skin every morning. Patient not taking: Reported on 08/19/2020 06/24/18   Romero Belling, MD  insulin glargine (LANTUS) 100 UNIT/ML injection Inject 0.4 mLs (40 Units total) into the skin at bedtime. Dispense insulin pen if approved, if not dispense as needed syringes and needles for 1 month supply. Can switch to Levemir. Diagnosis E 11.65. Patient not taking: Reported on 08/19/2020 05/23/18   Leroy Sea, MD  lisinopril (PRINIVIL,ZESTRIL) 10 MG tablet Take 0.5 tablets (5 mg total) by mouth daily. Patient not taking: Reported on 08/19/2020 05/23/18 06/22/18  Leroy Sea, MD    Allergies    Patient has no known allergies.  Review of Systems   Review of Systems  Respiratory: Positive for shortness of breath.   Gastrointestinal: Positive for nausea and vomiting.  Endocrine: Positive for polydipsia and polyuria.  Neurological: Positive for weakness.  All other systems reviewed and are negative.   Physical Exam Updated Vital Signs BP (!) 143/61   Pulse (!) 108   Temp 99.9 F (37.7 C)   Resp 15   SpO2 100%   Physical Exam Vitals and nursing note reviewed.  Constitutional:      General: He is not in acute distress.    Appearance: He is well-developed. He is obese.  HENT:     Head: Normocephalic and atraumatic.  Eyes:     Conjunctiva/sclera: Conjunctivae normal.  Cardiovascular:     Rate and Rhythm: Regular rhythm. Tachycardia present.  Pulmonary:      Effort: Pulmonary effort is normal. No respiratory distress.     Breath sounds: Normal breath sounds.  Abdominal:     General: Bowel sounds are normal.     Palpations: Abdomen is soft.     Tenderness: There is no abdominal tenderness.  Musculoskeletal:     Right lower leg: Edema (trace) present.     Left lower leg: Edema (trace) present.  Skin:    General: Skin is warm.     Comments: Acanthosis nigricans  Neurological:     Mental Status: He is alert.  Psychiatric:        Behavior: Behavior normal.     ED Results / Procedures / Treatments   Labs (all labs ordered are listed, but only abnormal results are displayed) Labs Reviewed  COMPREHENSIVE METABOLIC PANEL - Abnormal; Notable for the following components:      Result Value   Sodium 133 (*)    CO2 14 (*)    Glucose, Bld 443 (*)    Creatinine, Ser 1.55 (*)    Total Protein 8.7 (*)    AST 58 (*)    ALT 94 (*)    Total Bilirubin 2.7 (*)    Anion gap 19 (*)    All other components within normal limits  CBC - Abnormal; Notable for the following components:   WBC 15.7 (*)    All other components within normal limits  CBG MONITORING, ED - Abnormal; Notable for the following components:   Glucose-Capillary 453 (*)    All other components within normal limits  I-STAT VENOUS BLOOD GAS, ED - Abnormal; Notable for the following components:   pCO2, Ven 33.9 (*)    pO2, Ven 31.0 (*)    Bicarbonate 16.4 (*)    TCO2 17 (*)    Acid-base deficit 9.0 (*)    All other components within normal limits  CBG MONITORING, ED - Abnormal; Notable for the following components:   Glucose-Capillary 346 (*)    All other components within normal limits  SARS CORONAVIRUS 2 (TAT 6-24 HRS)  LIPASE, BLOOD  URINALYSIS, ROUTINE W REFLEX MICROSCOPIC  BETA-HYDROXYBUTYRIC ACID  BETA-HYDROXYBUTYRIC ACID    EKG EKG Interpretation  Date/Time:  Friday August 19 2020 15:28:46 EST Ventricular Rate:  146 PR Interval:  122 QRS Duration: 80 QT  Interval:  270 QTC Calculation: 420 R Axis:   70 Text Interpretation: Sinus tachycardia T wave abnormality, consider inferior ischemia T wave abnormality, consider anterolateral ischemia Abnormal ECG Since last tracing rate faster Confirmed by Richardean Canal [phone]) on 08/19/2020 6:39:55 PM   Radiology No results found.  Procedures .Critical Care Performed by: Shuronda Santino, [place] N, PA-C Authorized by: Suriah Peragine, [place] N, PA-C   Critical care provider statement:    Critical care time (minutes):  45   Critical care time was exclusive of:  Separately  billable procedures and treating other patients and teaching time   Critical care was necessary to treat or prevent imminent or life-threatening deterioration of the following conditions:  Endocrine crisis   Critical care was time spent personally by me on the following activities:  Discussions with consultants, evaluation of patient's response to treatment, examination of patient, ordering and performing treatments and interventions, ordering and review of laboratory studies, ordering and review of radiographic studies, pulse oximetry, re-evaluation of patient's condition, obtaining history from patient or surrogate and review of old charts   I assumed direction of critical care for this patient from another provider in my specialty: no       Medications Ordered in ED Medications  insulin regular, human (MYXREDLIN) 100 units/ 100 mL infusion (15 Units/hr Intravenous New Bag/Given 08/19/20 1843)  lactated ringers infusion ( Intravenous New Bag/Given 08/19/20 1848)  dextrose 5 % in lactated ringers infusion (has no administration in time range)  dextrose 50 % solution 0-50 mL (has no administration in time range)  potassium chloride 10 mEq in 100 mL IVPB (10 mEq Intravenous New Bag/Given 08/19/20 1849)  lactated ringers bolus 2,000 mL (2,000 mLs Intravenous New Bag/Given 08/19/20 1650)  ondansetron (ZOFRAN) injection 4 mg (4 mg Intravenous Given  08/19/20 1706)    ED Course  I have reviewed the triage vital signs and the nursing notes.  Pertinent labs & imaging results that were available during my care of the patient were reviewed by me and considered in my medical decision making (see chart for details).  Clinical Course as of 08/19/20 1906  Fri Aug 19, 2020  1746 Anion gap(!): 19 [JR]  1746 CO2(!): 14 [JR]  1746 Creatinine(!): 1.55 [JR]  1746 Sodium(!): 133 Corrected Na 138 [JR]  1746 Will initiate glucose stabilizer and plan for admission for DKA [JR]    Clinical Course User Index [JR] Jatavious Peppard, [place] N, PA-C   MDM Rules/Calculators/A&P                          Patient presenting for weakness, nausea vomiting over the last couple of weeks.  Also endorses polyuria and polydipsia.  He is significantly tachycardic on arrival, also with tachypnea though no respiratory distress.  Presents thinking his symptoms are attributed to cannabinoid induced hyperemesis due to very frequent marijuana use, however CBG is 453.  Concern for DKA with accompanying tachycardia and tachypnea.  IV fluids ordered, pending metabolic panel, will likely require insulin and admission for DKA.  Metabolic panel is consistent with DKA, high anion gap of 19, bicarb 14, AKI creatinine of 1.55.  Corrected sodium is 138.  VBG with pH of 7.29.  Insulin infusion is ordered, nausea is improved with Zofran.  Patient is in agreement with plan for admission.  Dr. Mikeal Hawthorne is accepting.   Final Clinical Impression(s) / ED Diagnoses Final diagnoses:  Diabetic ketoacidosis without coma associated with type 2 diabetes mellitus [facility])    Rx / DC Orders ED Discharge Orders    None       Zineb Glade, [place] N, PA-C 08/19/20 1906    Charlynne Pander, MD 08/22/20 [phone]

## 2020-08-20 ENCOUNTER — Encounter (HOSPITAL_COMMUNITY): Payer: Self-pay | Admitting: Internal Medicine

## 2020-08-20 DIAGNOSIS — E1110 Type 2 diabetes mellitus with ketoacidosis without coma: Secondary | ICD-10-CM | POA: Diagnosis not present

## 2020-08-20 DIAGNOSIS — F1210 Cannabis abuse, uncomplicated: Secondary | ICD-10-CM | POA: Diagnosis not present

## 2020-08-20 DIAGNOSIS — N179 Acute kidney failure, unspecified: Secondary | ICD-10-CM | POA: Diagnosis not present

## 2020-08-20 DIAGNOSIS — I10 Essential (primary) hypertension: Secondary | ICD-10-CM | POA: Diagnosis not present

## 2020-08-20 LAB — SARS CORONAVIRUS 2 (TAT 6-24 HRS): SARS Coronavirus 2: NEGATIVE

## 2020-08-20 LAB — CBC
HCT: 40 % (ref 39.0–52.0)
Hemoglobin: 14.1 g/dL (ref 13.0–17.0)
MCH: 29.3 pg (ref 26.0–34.0)
MCHC: 35.3 g/dL (ref 30.0–36.0)
MCV: 83.2 fL (ref 80.0–100.0)
Platelets: 251 10*3/uL (ref 150–400)
RBC: 4.81 MIL/uL (ref 4.22–5.81)
RDW: 12.5 % (ref 11.5–15.5)
WBC: 12.8 10*3/uL — ABNORMAL HIGH (ref 4.0–10.5)
nRBC: 0 % (ref 0.0–0.2)

## 2020-08-20 LAB — GLUCOSE, CAPILLARY
Glucose-Capillary: 156 mg/dL — ABNORMAL HIGH (ref 70–99)
Glucose-Capillary: 165 mg/dL — ABNORMAL HIGH (ref 70–99)
Glucose-Capillary: 165 mg/dL — ABNORMAL HIGH (ref 70–99)
Glucose-Capillary: 187 mg/dL — ABNORMAL HIGH (ref 70–99)
Glucose-Capillary: 202 mg/dL — ABNORMAL HIGH (ref 70–99)
Glucose-Capillary: 214 mg/dL — ABNORMAL HIGH (ref 70–99)
Glucose-Capillary: 242 mg/dL — ABNORMAL HIGH (ref 70–99)
Glucose-Capillary: 263 mg/dL — ABNORMAL HIGH (ref 70–99)
Glucose-Capillary: 263 mg/dL — ABNORMAL HIGH (ref 70–99)
Glucose-Capillary: 324 mg/dL — ABNORMAL HIGH (ref 70–99)

## 2020-08-20 LAB — BASIC METABOLIC PANEL
Anion gap: 12 (ref 5–15)
Anion gap: 12 (ref 5–15)
BUN: 6 mg/dL (ref 6–20)
BUN: 6 mg/dL (ref 6–20)
CO2: 21 mmol/L — ABNORMAL LOW (ref 22–32)
CO2: 20 mmol/L — ABNORMAL LOW (ref 22–32)
Calcium: 9.6 mg/dL (ref 8.9–10.3)
Calcium: 9.6 mg/dL (ref 8.9–10.3)
Chloride: 104 mmol/L (ref 98–111)
Chloride: 104 mmol/L (ref 98–111)
Creatinine, Ser: 1.01 mg/dL (ref 0.61–1.24)
Creatinine, Ser: 1 mg/dL (ref 0.61–1.24)
GFR, Estimated: >60 mL/min (ref >60)
GFR, Estimated: >60 mL/min (ref >60)
Glucose, Bld: 174 mg/dL — ABNORMAL HIGH (ref 70–99)
Glucose, Bld: 213 mg/dL — ABNORMAL HIGH (ref 70–99)
Potassium: 3.1 mmol/L — ABNORMAL LOW (ref 3.5–5.1)
Potassium: 3.1 mmol/L — ABNORMAL LOW (ref 3.5–5.1)
Sodium: 137 mmol/L (ref 135–145)
Sodium: 136 mmol/L (ref 135–145)

## 2020-08-20 LAB — BETA-HYDROXYBUTYRIC ACID: Beta-Hydroxybutyric Acid: 3.97 mmol/L — ABNORMAL HIGH (ref 0.05–0.27)

## 2020-08-20 MED ORDER — POTASSIUM CHLORIDE CRYS ER 20 MEQ PO TBCR
40.0000 meq | EXTENDED_RELEASE_TABLET | ORAL | Status: AC
  Administered 2020-08-20 (×2): 40 meq via ORAL
  Filled 2020-08-20 (×2): qty 2

## 2020-08-20 MED ORDER — INSULIN GLARGINE 100 UNIT/ML ~~LOC~~ SOLN
25.0000 [IU] | Freq: Every day | SUBCUTANEOUS | Status: DC
  Administered 2020-08-20: 25 [IU] via SUBCUTANEOUS
  Filled 2020-08-20: qty 0.25

## 2020-08-20 MED ORDER — ACETAMINOPHEN 325 MG PO TABS
650.0000 mg | ORAL_TABLET | Freq: Four times a day (QID) | ORAL | Status: DC | PRN
  Administered 2020-08-20 (×2): 650 mg via ORAL
  Filled 2020-08-20 (×2): qty 2

## 2020-08-20 MED ORDER — INSULIN STARTER KIT- PEN NEEDLES (ENGLISH)
1.0000 | Freq: Once | Status: AC
  Administered 2020-08-21: 1
  Filled 2020-08-20: qty 1

## 2020-08-20 MED ORDER — ENOXAPARIN SODIUM 80 MG/0.8ML ~~LOC~~ SOLN
70.0000 mg | SUBCUTANEOUS | Status: DC
  Administered 2020-08-20: 70 mg via SUBCUTANEOUS
  Filled 2020-08-20: qty 0.8

## 2020-08-20 MED ORDER — ENOXAPARIN SODIUM 80 MG/0.8ML ~~LOC~~ SOLN: 70.0000 mg | SUBCUTANEOUS | Status: DC

## 2020-08-20 MED ORDER — INSULIN ASPART 100 UNIT/ML ~~LOC~~ SOLN
0.0000 [IU] | Freq: Three times a day (TID) | SUBCUTANEOUS | Status: DC
  Administered 2020-08-20: 8 [IU] via SUBCUTANEOUS
  Administered 2020-08-20: 5 [IU] via SUBCUTANEOUS
  Administered 2020-08-20: 8 [IU] via SUBCUTANEOUS
  Administered 2020-08-20: 11 [IU] via SUBCUTANEOUS
  Administered 2020-08-21: 8 [IU] via SUBCUTANEOUS

## 2020-08-20 MED ORDER — INSULIN GLARGINE 100 UNIT/ML ~~LOC~~ SOLN
15.0000 [IU] | Freq: Once | SUBCUTANEOUS | Status: AC
  Administered 2020-08-20: 15 [IU] via SUBCUTANEOUS
  Filled 2020-08-20: qty 0.15

## 2020-08-20 MED ORDER — INSULIN ASPART 100 UNIT/ML ~~LOC~~ SOLN
6.0000 [IU] | Freq: Three times a day (TID) | SUBCUTANEOUS | Status: DC
  Administered 2020-08-20 (×3): 6 [IU] via SUBCUTANEOUS

## 2020-08-20 MED ORDER — GLUCERNA SHAKE PO LIQD
237.0000 mL | Freq: Three times a day (TID) | ORAL | Status: DC
  Administered 2020-08-20 – 2020-08-21 (×3): 237 mL via ORAL

## 2020-08-20 MED ORDER — PROSOURCE PLUS PO LIQD
30.0000 mL | Freq: Two times a day (BID) | ORAL | Status: DC
  Administered 2020-08-20: 30 mL via ORAL
  Filled 2020-08-20 (×2): qty 30

## 2020-08-20 MED ORDER — INSULIN GLARGINE 100 UNIT/ML ~~LOC~~ SOLN
40.0000 [IU] | Freq: Every day | SUBCUTANEOUS | Status: DC
  Administered 2020-08-21: 40 [IU] via SUBCUTANEOUS
  Filled 2020-08-20: qty 0.4

## 2020-08-20 NOTE — Progress Notes (Signed)
Patient ID: Erik Harper, male   DOB: February 08, 1996, 25 y.o.   MRN: [id number]  PROGRESS NOTE    Erik Harper  ZHY:[id number] DOB: 07-13-1995 DOA: 08/19/2020 PCP: Patient, No Pcp Per   Brief Narrative:  25 year old male with history of diabetes mellitus type 2 was not been on any medications recently, morbid obesity and hypertension presented with weakness, nausea and vomiting.  On presentation, he was found to have DKA and was started on insulin drip.  Subsequently, his anion gap closed and he has been transitioned to long-acting insulin  Assessment & Plan:   Diabetic ketoacidosis in a patient with diabetes mellitus type 2 not on any medications at home -Presented with DKA and was treated with IV fluids and insulin drip. -Subsequently, his anion gap closed and he has been transitioned to long-acting insulin -Follow A1c.  Diabetes coordinator consult.  Continue long-acting insulin along with CBGs and SSI.  Carb modified diet. -Counseled regarding compliance -Consult nutrition/dietary  Acute kidney injury  -most likely prerenal.  Resolved  Leukocytosis -Probably reactive.  Improving  Mild hyponatremia -Resolved  Hypokalemia -Replace.  Repeat a.m. labs  Essential hypertension -Blood pressure intermittently on the higher side.  Might have to restart antihypertensives; currently not taking any at home  Cannabis abuse -Was consulted by admitting provider  Morbid obesity -Outpatient follow-up   DVT prophylaxis: Lovenox Code Status: Full Family Communication: None at bedside Disposition Plan: Status is: Inpatient  Remains inpatient appropriate because:Inpatient level of care appropriate due to severity of illness   Dispo: The patient is from: Home              Anticipated d/c is to: Home in 1 to 2 days if clinically improves, tolerates diet and blood sugars improve              Patient currently is not medically stable to d/c.   Difficult to place patient  No   Consultants: None  Procedures: None  Antimicrobials: None   Subjective: Patient seen and examined at bedside.  He feels slightly better.  Feels slightly hungry this morning.  Denies any overnight fever.  Still has intermittent nausea but no current vomiting.  Objective: Vitals:   08/19/20 2000 08/19/20 2130 08/19/20 2252 08/20/20 0840  BP: (!) 146/78 (!) 121/98 (!) 155/99 (!) 148/88  Pulse: (!) 123 (!) 125 (!) 110 93  Resp: 20 20 18 20   Temp:   98.2 F (36.8 C) 98.1 F (36.7 C)  TempSrc:   Oral   SpO2: 98% 98% 99% 100%  Weight: (!) 142.9 kg  (!) 143.3 kg   Height: 6' (1.829 m)  6' (1.829 m)    No intake or output data in the 24 hours ending 08/20/20 1020 Filed Weights   08/19/20 2000 08/19/20 2252  Weight: (!) 142.9 kg (!) 143.3 kg    Examination:  General exam: Appears calm and comfortable  Respiratory system: Bilateral decreased breath sounds at bases Cardiovascular system: S1 & S2 heard, intermittently tachycardic Gastrointestinal system: Abdomen is morbidly obese, nondistended, soft and nontender. Normal bowel sounds heard. Extremities: No cyanosis, clubbing, edema  Central nervous system: Alert and oriented. No focal neurological deficits. Moving extremities Skin: No rashes, lesions or ulcers Psychiatry: Flat affect   Data Reviewed: I have personally reviewed following labs and imaging studies  CBC: Recent Labs  Lab 08/19/20 1638 08/19/20 1818 08/20/20 0148  WBC 15.7*  --  12.8*  HGB 16.5 15.6 14.1  HCT 47.3 46.0 40.0  MCV 82.7  --  83.2  PLT 345  --  251   Basic Metabolic Panel: Recent Labs  Lab 08/19/20 1638 08/19/20 1818 08/19/20 1915 08/20/20 0148 08/20/20 0530  NA 133* 138 134* 137 136  K 3.6 4.0 3.7 3.1* 3.1*  CL 100  --  102 104 104  CO2 14*  --  15* 21* 20*  GLUCOSE 443*  --  261* 174* 213*  BUN 9  --  8 6 6   CREATININE 1.55*  --  1.19 1.01 1.00  CALCIUM 10.3  --  9.7 9.6 9.6   GFR: Estimated Creatinine Clearance: 168.8  mL/min (by C-G formula based on SCr of 1 mg/dL). Liver Function Tests: Recent Labs  Lab 08/19/20 1638  AST 58*  ALT 94*  ALKPHOS 80  BILITOT 2.7*  PROT 8.7*  ALBUMIN 4.7   Recent Labs  Lab 08/19/20 1638  LIPASE 32   No results for input(s): AMMONIA in the last 168 hours. Coagulation Profile: No results for input(s): INR, PROTIME in the last 168 hours. Cardiac Enzymes: No results for input(s): CKTOTAL, CKMB, CKMBINDEX, TROPONINI in the last 168 hours. BNP (last 3 results) No results for input(s): PROBNP in the last 8760 hours. HbA1C: Recent Labs    08/19/20 1915  HGBA1C 11.3*   CBG: Recent Labs  Lab 08/20/20 0220 08/20/20 0422 08/20/20 0522 08/20/20 0630 08/20/20 0839  GLUCAP 156* 214* 187* 202* 242*   Lipid Profile: No results for input(s): CHOL, HDL, LDLCALC, TRIG, CHOLHDL, LDLDIRECT in the last 72 hours. Thyroid Function Tests: No results for input(s): TSH, T4TOTAL, FREET4, T3FREE, THYROIDAB in the last 72 hours. Anemia Panel: No results for input(s): VITAMINB12, FOLATE, FERRITIN, TIBC, IRON, RETICCTPCT in the last 72 hours. Sepsis Labs: No results for input(s): PROCALCITON, LATICACIDVEN in the last 168 hours.  Recent Results (from the past 240 hour(s))  SARS CORONAVIRUS 2 (TAT 6-24 HRS) Nasopharyngeal Urine, Clean Catch     Status: None   Collection Time: 08/19/20  5:46 PM   Specimen: Urine, Clean Catch; Nasopharyngeal  Result Value Ref Range Status   SARS Coronavirus 2 NEGATIVE NEGATIVE Final    Comment: (NOTE) SARS-CoV-2 target nucleic acids are NOT DETECTED.  The SARS-CoV-2 RNA is generally detectable in upper and lower respiratory specimens during the acute phase of infection. Negative results do not preclude SARS-CoV-2 infection, do not rule out co-infections with other pathogens, and should not be used as the sole basis for treatment or other patient management decisions. Negative results must be combined with clinical observations, patient  history, and epidemiological information. The expected result is Negative.  Fact Sheet for Patients: 10/19/20  Fact Sheet for Healthcare Providers: HairSlick.no  This test is not yet approved or cleared by the quierodirigir.com FDA and  has been authorized for detection and/or diagnosis of SARS-CoV-2 by FDA under an Emergency Use Authorization (EUA). This EUA will remain  in effect (meaning this test can be used) for the duration of the COVID-19 declaration under Se ction 564(b)(1) of the Act, 21 U.S.C. section 360bbb-3(b)(1), unless the authorization is terminated or revoked sooner.  Performed at [facility], [address] [place], [place]          Radiology Studies: No results found.      Scheduled Meds: . enoxaparin (LOVENOX) injection  70 mg Subcutaneous Q24H  . insulin aspart  0-15 Units Subcutaneous TID AC & HS  . insulin aspart  6 Units Subcutaneous TID WC  . insulin glargine  25 Units Subcutaneous  Daily   Continuous Infusions:        Glade Lloyd, MD [facility] 08/20/2020, 10:20 AM

## 2020-08-20 NOTE — Progress Notes (Signed)
Initial Nutrition Assessment  DOCUMENTATION CODES:   Morbid obesity  INTERVENTION:   Glucerna Shake po TID, each supplement provides 220 kcal and 10 grams of protein  52ml Prosource Plus po BID, each supplement provides 100 kcals and 15 grams of protein  Provided information regarding diabetic diet  NUTRITION DIAGNOSIS:   Inadequate oral intake related to nausea as evidenced by per patient/family report.  GOAL:   Patient will meet greater than or equal to 90% of their needs  MONITOR:   PO intake,Supplement acceptance,Labs,Weight trends,I & O's  REASON FOR ASSESSMENT:   Consult Diet education  ASSESSMENT:   Pt admitted with DKA. PMH includes DM (documented as type 1 and type 2) and HTN   Pt reports he has not taken his medications in nearly 2 years because his blood sugar improved after exercise, so he did not think he needed them anymore.   Pt states that he has had nausea and a poor appetite for the last couple of months. Pt reports this has led to him losing ~70lbs in 2 months. Unable to verify this with the weight readings available in the pt's chart, but this would indicate a clinically significant 18% wt loss x2 months. Suspect pt meets criteria for malnutrition; however, unable to diagnose without nutrition-focused physical exam (cannot perform at this time as RD is working remotely from another campus.)  Pt states that he is still experiencing intermittent nausea today, but does endorse some feelings of hunger. No PO intake documented (pt was NPO until 0350 this morning due to DKA). Will order oral nutrition supplements for pt to provide additional kcals/protein.   Medications: ss novolog TID before meals and bedtime, 6 units novolog TID with meals, 40 units lantus daily Labs: K+ 3.1 (L) CBGs [phone] (Diabetes Coordinator following)  Diet Order:   Diet Order            Diet Carb Modified Fluid consistency: Thin; Room service appropriate? Yes  Diet effective  now                 EDUCATION NEEDS:   Education needs have been addressed  Skin:  Skin Assessment: Reviewed RN Assessment  Last BM:  PTA  Height:   Ht Readings from Last 1 Encounters:  08/19/20 6' (1.829 m)    Weight:   Wt Readings from Last 1 Encounters:  08/19/20 (!) 143.3 kg   BMI:  Body mass index is 42.85 kg/m.  Estimated Nutritional Needs:   Kcal:  2600-2800  Protein:  160-200 grams  Fluid:  >2.6L    Eugene Gavia, MS, RD, LDN RD pager number and weekend/on-call pager number located in Amion.

## 2020-08-20 NOTE — Progress Notes (Signed)
HOSPITAL MEDICINE OVERNIGHT EVENT NOTE    Notified by nursing she has received ENDOTOOL alert that patient is to be transitioned off of insulin infusion.  Chart reviewed.  Gap is closed and beta-hydroxybutyrate level downtrending.  Patient not on scheduled basal insulin at home - will place on approx 0.3 units per kg of basal insulin which includes Lantus 25 units Qdaily with Novolog 6 units with each meal.    Cardiac/Diabetic diet additionally started.  Insulin infusion to be stopped 2 hrs after administration of basal insulin administration.    Initiating accuchecks QAC and QHS with sliding scale insulin.  Additionally, patient found to have potassium of 3.1 on last chemistry.  Will replace with oral potassium chloride.     Marinda Elk  MD [facility]

## 2020-08-20 NOTE — Plan of Care (Signed)
  Problem: Coping: Goal: Level of anxiety will decrease 08/20/2020 0113 by Joanne Chars, RN Outcome: Progressing 08/20/2020 0112 by Joanne Chars, RN Outcome: Progressing   Problem: Elimination: Goal: Will not experience complications related to bowel motility 08/20/2020 0113 by Joanne Chars, RN Outcome: Progressing 08/20/2020 0112 by Joanne Chars, RN Outcome: Progressing Goal: Will not experience complications related to urinary retention 08/20/2020 0113 by Joanne Chars, RN Outcome: Progressing 08/20/2020 0112 by Joanne Chars, RN Outcome: Progressing   Problem: Pain Managment: Goal: General experience of comfort will improve 08/20/2020 0113 by Joanne Chars, RN Outcome: Progressing 08/20/2020 0112 by Joanne Chars, RN Outcome: Progressing   Problem: Safety: Goal: Ability to remain free from injury will improve 08/20/2020 0113 by Joanne Chars, RN Outcome: Progressing 08/20/2020 0112 by Joanne Chars, RN Outcome: Progressing   Problem: Skin Integrity: Goal: Risk for impaired skin integrity will decrease 08/20/2020 0113 by Joanne Chars, RN Outcome: Progressing 08/20/2020 0112 by Joanne Chars, RN Outcome: Progressing

## 2020-08-20 NOTE — Discharge Instructions (Signed)
Outpatient Diabetes Education  You have been referred to [facility] for outpatient diabetes education. A referral has been sent and the office will contact you for an appointment. For additional questions or to schedule an appointment, call [phone].    Carbohydrate Counting For People With Diabetes  Foods with carbohydrates make your blood glucose level go up. Learning how to count carbohydrates can help you control your blood glucose levels. First, identify the foods you eat that contain carbohydrates. Then, using the Foods with Carbohydrates chart, determine about how much carbohydrates are in your meals and snacks. Make sure you are eating foods with fiber, protein, and healthy fat along with your carbohydrate foods. Foods with Carbohydrates The following table shows carbohydrate foods that have about 15 grams of carbohydrate each. Using measuring cups, spoons, or a food scale when you first begin learning about carbohydrate counting can help you learn about the portion sizes you typically eat. The following foods have 15 grams carbohydrate each:  Grains . 1 slice bread (1 ounce)  . 1 small tortilla (6-inch size)  .  large bagel (1 ounce)  . 1/3 cup pasta or rice (cooked)  .  hamburger or hot dog bun ( ounce)  .  cup cooked cereal  .  to  cup ready-to-eat cereal  . 2 taco shells (5-inch size) Fruit . 1 small fresh fruit ( to 1 cup)  .  medium banana  . 17 small grapes (3 ounces)  . 1 cup melon or berries  .  cup canned or frozen fruit  . 2 tablespoons dried fruit (blueberries, cherries, cranberries, raisins)  .  cup unsweetened fruit juice  Starchy Vegetables .  cup cooked beans, peas, corn, potatoes/sweet potatoes  .  large baked potato (3 ounces)  . 1 cup acorn or butternut squash  Snack Foods . 3 to 6 crackers  . 8 potato chips or 13 tortilla chips ( ounce to 1 ounce)  . 3 cups popped popcorn  Dairy . 3/4 cup (6  ounces) nonfat plain yogurt, or yogurt with sugar-free sweetener  . 1 cup milk  . 1 cup plain rice, soy, coconut or flavored almond milk Sweets and Desserts .  cup ice cream or frozen yogurt  . 1 tablespoon jam, jelly, pancake syrup, table sugar, or honey  . 2 tablespoons light pancake syrup  . 1 inch square of frosted cake or 2 inch square of unfrosted cake  . 2 small cookies (2/3 ounce each) or  large cookie  Sometimes you'll have to estimate carbohydrate amounts if you don't know the exact recipe. One cup of mixed foods like soups can have 1 to 2 carbohydrate servings, while some casseroles might have 2 or more servings of carbohydrate. Foods that have less than 20 calories in each serving can be counted as "free" foods. Count 1 cup raw vegetables, or  cup cooked non-starchy vegetables as "free" foods. If you eat 3 or more servings at one meal, then count them as 1 carbohydrate serving.  Foods without Carbohydrates  Not all foods contain carbohydrates. Meat, some dairy, fats, non-starchy vegetables, and many beverages don't contain carbohydrate. So when you count carbohydrates, you can generally exclude chicken, pork, beef, fish, seafood, eggs, tofu, cheese, butter, sour cream, avocado, nuts, seeds, olives, mayonnaise, water, black coffee, unsweetened tea, and zero-calorie drinks. Vegetables with no or low carbohydrate include green beans, cauliflower, tomatoes, and onions. How much carbohydrate should I eat at each meal?  Carbohydrate   counting can help you plan your meals and manage your weight. Following are some starting points for carbohydrate intake at each meal. Work with your registered dietitian nutritionist to find the best range that works for your blood glucose and weight.   To Lose Weight To Maintain Weight  Women 2 - 3 carb servings 3 - 4 carb servings  Men 3 - 4 carb servings 4 - 5 carb servings  Checking your blood glucose after meals will help you know if you need to adjust the  timing, type, or number of carbohydrate servings in your meal plan. Achieve and keep a healthy body weight by balancing your food intake and physical activity.  Tips How should I plan my meals?  Plan for half the food on your plate to include non-starchy vegetables, like salad greens, broccoli, or carrots. Try to eat 3 to 5 servings of non-starchy vegetables every day. Have a protein food at each meal. Protein foods include chicken, fish, meat, eggs, or beans (note that beans contain carbohydrate). These two food groups (non-starchy vegetables and proteins) are low in carbohydrate. If you fill up your plate with these foods, you will eat less carbohydrate but still fill up your stomach. Try to limit your carbohydrate portion to  of the plate.  What fats are healthiest to eat?  Diabetes increases risk for heart disease. To help protect your heart, eat more healthy fats, such as olive oil, nuts, and avocado. Eat less saturated fats like butter, cream, and high-fat meats, like bacon and sausage. Avoid trans fats, which are in all foods that list "partially hydrogenated oil" as an ingredient. What should I drink?  Choose drinks that are not sweetened with sugar. The healthiest choices are water, carbonated or seltzer waters, and tea and coffee without added sugars.  Sweet drinks will make your blood glucose go up very quickly. One serving of soda or energy drink is  cup. It is best to drink these beverages only if your blood glucose is low.  Artificially sweetened, or diet drinks, typically do not increase your blood glucose if they have zero calories in them. Read labels of beverages, as some diet drinks do have carbohydrate and will raise your blood glucose. Label Reading Tips Read Nutrition Facts labels to find out how many grams of carbohydrate are in a food you want to eat. Don't forget: sometimes serving sizes on the label aren't the same as how much food you are going to eat, so you may need to  calculate how much carbohydrate is in the food you are serving yourself.   Carbohydrate Counting for People with Diabetes Sample 1-Day Menu  Breakfast  cup yogurt, low fat, low sugar (1 carbohydrate serving)   cup cereal, ready-to-eat, unsweetened (1 carbohydrate serving)  1 cup strawberries (1 carbohydrate serving)   cup almonds ( carbohydrate serving)  Lunch 1, 5 ounce can chunk light tuna  2 ounces cheese, low fat cheddar  6 whole wheat crackers (1 carbohydrate serving)  1 small apple (1 carbohydrate servings)   cup carrots ( carbohydrate serving)   cup snap peas  1 cup 1% milk (1 carbohydrate serving)   Evening Meal Stir fry made with: 3 ounces chicken  1 cup brown rice (3 carbohydrate servings)   cup broccoli ( carbohydrate serving)   cup green beans   cup onions  1 tablespoon olive oil  2 tablespoons teriyaki sauce ( carbohydrate serving)  Evening Snack 1 extra small banana (1 carbohydrate serving)  1   tablespoon peanut butter   Carbohydrate Counting for People with Diabetes Vegan Sample 1-Day Menu  Breakfast 1 cup cooked oatmeal (2 carbohydrate servings)   cup blueberries (1 carbohydrate serving)  2 tablespoons flaxseeds  1 cup soymilk fortified with calcium and vitamin D  1 cup coffee  Lunch 2 slices whole wheat bread (2 carbohydrate servings)   cup baked tofu   cup lettuce  2 slices tomato  2 slices avocado   cup baby carrots ( carbohydrate serving)  1 orange (1 carbohydrate serving)  1 cup soymilk fortified with calcium and vitamin D   Evening Meal Burrito made with: 1 6-inch corn tortilla (1 carbohydrate serving)  1 cup refried vegetarian beans (2 carbohydrate servings)   cup chopped tomatoes   cup lettuce   cup salsa  1/3 cup brown rice (1 carbohydrate serving)  1 tablespoon olive oil for rice   cup zucchini   Evening Snack 6 small whole grain crackers (1 carbohydrate serving)  2 apricots ( carbohydrate serving)   cup unsalted peanuts  ( carbohydrate serving)    Carbohydrate Counting for People with Diabetes Vegetarian (Lacto-Ovo) Sample 1-Day Menu  Breakfast 1 cup cooked oatmeal (2 carbohydrate servings)   cup blueberries (1 carbohydrate serving)  2 tablespoons flaxseeds  1 egg  1 cup 1% milk (1 carbohydrate serving)  1 cup coffee  Lunch 2 slices whole wheat bread (2 carbohydrate servings)  2 ounces low-fat cheese   cup lettuce  2 slices tomato  2 slices avocado   cup baby carrots ( carbohydrate serving)  1 orange (1 carbohydrate serving)  1 cup unsweetened tea  Evening Meal Burrito made with: 1 6-inch corn tortilla (1 carbohydrate serving)   cup refried vegetarian beans (1 carbohydrate serving)   cup tomatoes   cup lettuce   cup salsa  1/3 cup brown rice (1 carbohydrate serving)  1 tablespoon olive oil for rice   cup zucchini  1 cup 1% milk (1 carbohydrate serving)  Evening Snack 6 small whole grain crackers (1 carbohydrate serving)  2 apricots ( carbohydrate serving)   cup unsalted peanuts ( carbohydrate serving)    Copyright 2020  Academy of [employer] and Dietetics. All rights reserved.  Using Nutrition Labels: Carbohydrate  . Serving Size  . Look at the serving size. All the information on the label is based on this portion. . Servings Per Container  . The number of servings contained in the package. . Guidelines for Carbohydrate  . Look at the total grams of carbohydrate in the serving size.  . 1 carbohydrate choice = 15 grams of carbohydrate. Range of Carbohydrate Grams Per Choice  Carbohydrate Grams/Choice Carbohydrate Choices  6-10   11-20 1  21-25 1  26-35 2  36-40 2  41-50 3  51-55 3  56-65 4  66-70 4  71-80 5    Copyright 2020  Academy of [employer] and Dietetics. All rights reserved.   

## 2020-08-20 NOTE — Plan of Care (Signed)

## 2020-08-20 NOTE — Progress Notes (Signed)
Inpatient Diabetes Program Recommendations  AACE/ADA: New Consensus Statement on Inpatient Glycemic Control (2015)  Target Ranges:  Prepandial:   less than 140 mg/dL      Peak postprandial:   less than 180 mg/dL (1-2 hours)      Critically ill patients:  140 - 180 mg/dL   Lab Results  Component Value Date   GLUCAP 263 (H) 08/20/2020   HGBA1C 11.3 (H) 08/19/2020    Review of Glycemic Control  Diabetes history: DM Outpatient Diabetes medications: None Current orders for Inpatient glycemic control: Lantus 25 units + Novolog 6 units tid + Novolog 0-15 correction qid  Inpatient Diabetes Program Recommendations:   Spoke with pt. Via phone and discussed hx DM. Patient states he was told he was type 2 although endocrinologist Dr. Loanne Drilling listed as type 1. Patient states his MD in [place] discontinued all meds approximately 1 year ago. Patient has lost 70 lbs over the past 2 months and lack of appetite. -Discharge needs: -lantus solostar [phone] -Novolog [phone] -Pen needles [phone] -Glucose meter kit [id number]  -C Peptide to help evaluate patient for type 1  Spoke with pt A1C11.3 (average blood glucose 278 over the past 2-3 months) results with them and explained what an A1C is, basic pathophysiology of DM Type 2, basic home care, basic diabetes diet nutrition principles, importance of checking CBGs and maintaining good CBG control to prevent long-term and short-term complications. Reviewed signs and symptoms of hyperglycemia and hypoglycemia and how to treat hypoglycemia at home. Also reviewed blood sugar goals at home.  RNs to provide ongoing basic DM education at bedside with this patient.  Thank you, Nani Gasser. Philopateer Strine, RN, MSN, CDE  Diabetes Coordinator Inpatient Glycemic Control Team Team Pager [phone] (8am-5pm) 08/20/2020 1:54 PM

## 2020-08-21 DIAGNOSIS — F1210 Cannabis abuse, uncomplicated: Secondary | ICD-10-CM | POA: Diagnosis not present

## 2020-08-21 DIAGNOSIS — N179 Acute kidney failure, unspecified: Secondary | ICD-10-CM | POA: Diagnosis not present

## 2020-08-21 DIAGNOSIS — E1110 Type 2 diabetes mellitus with ketoacidosis without coma: Secondary | ICD-10-CM | POA: Diagnosis not present

## 2020-08-21 LAB — GLUCOSE, CAPILLARY
Glucose-Capillary: 280 mg/dL — ABNORMAL HIGH (ref 70–99)
Glucose-Capillary: 298 mg/dL — ABNORMAL HIGH (ref 70–99)

## 2020-08-21 LAB — BASIC METABOLIC PANEL
Anion gap: 12 (ref 5–15)
BUN: 5 mg/dL — ABNORMAL LOW (ref 6–20)
CO2: 20 mmol/L — ABNORMAL LOW (ref 22–32)
Calcium: 9.2 mg/dL (ref 8.9–10.3)
Chloride: 100 mmol/L (ref 98–111)
Creatinine, Ser: 0.97 mg/dL (ref 0.61–1.24)
GFR, Estimated: >60 mL/min (ref >60)
Glucose, Bld: 264 mg/dL — ABNORMAL HIGH (ref 70–99)
Potassium: 3 mmol/L — ABNORMAL LOW (ref 3.5–5.1)
Sodium: 132 mmol/L — ABNORMAL LOW (ref 135–145)

## 2020-08-21 LAB — CBC WITH DIFFERENTIAL/PLATELET
Abs Immature Granulocytes: 0.05 10*3/uL (ref 0.00–0.07)
Basophils Absolute: 0.2 10*3/uL — ABNORMAL HIGH (ref 0.0–0.1)
Basophils Relative: 2 %
Eosinophils Absolute: 0.3 10*3/uL (ref 0.0–0.5)
Eosinophils Relative: 4 %
HCT: 40 % (ref 39.0–52.0)
Hemoglobin: 13.9 g/dL (ref 13.0–17.0)
Immature Granulocytes: 1 %
Lymphocytes Relative: 36 %
Lymphs Abs: 3.5 10*3/uL (ref 0.7–4.0)
MCH: 29 pg (ref 26.0–34.0)
MCHC: 34.8 g/dL (ref 30.0–36.0)
MCV: 83.5 fL (ref 80.0–100.0)
Monocytes Absolute: 1.1 10*3/uL — ABNORMAL HIGH (ref 0.1–1.0)
Monocytes Relative: 12 %
Neutro Abs: 4.5 10*3/uL (ref 1.7–7.7)
Neutrophils Relative %: 45 %
Platelets: 230 10*3/uL (ref 150–400)
RBC: 4.79 MIL/uL (ref 4.22–5.81)
RDW: 12.4 % (ref 11.5–15.5)
WBC: 9.6 10*3/uL (ref 4.0–10.5)
nRBC: 0 % (ref 0.0–0.2)

## 2020-08-21 LAB — C-PEPTIDE: C-Peptide: 3.3 ng/mL (ref 1.1–4.4)

## 2020-08-21 LAB — MAGNESIUM: Magnesium: 1.8 mg/dL (ref 1.7–2.4)

## 2020-08-21 MED ORDER — INSULIN ASPART PROT & ASPART (70-30 MIX) 100 UNIT/ML ~~LOC~~ SUSP: 30.0000 [IU] | Freq: Two times a day (BID) | SUBCUTANEOUS | 1 refills | Status: DC

## 2020-08-21 MED ORDER — BLOOD GLUCOSE METER KIT: PACK | 0 refills | Status: DC

## 2020-08-21 MED ORDER — INSULIN ASPART 100 UNIT/ML FLEXPEN: 6.0000 [IU] | PEN_INJECTOR | Freq: Three times a day (TID) | SUBCUTANEOUS | 0 refills | Status: DC

## 2020-08-21 MED ORDER — INSULIN SYRINGE 31G X 5/16" 0.5 ML MISC: 0 refills | Status: DC

## 2020-08-21 MED ORDER — LANTUS SOLOSTAR 100 UNIT/ML ~~LOC~~ SOPN: 40.0000 [IU] | PEN_INJECTOR | Freq: Every day | SUBCUTANEOUS | 0 refills | Status: DC

## 2020-08-21 MED ORDER — INSULIN ASPART 100 UNIT/ML ~~LOC~~ SOLN: 6.0000 [IU] | Freq: Three times a day (TID) | SUBCUTANEOUS | 0 refills | Status: DC

## 2020-08-21 MED ORDER — POTASSIUM CHLORIDE CRYS ER 20 MEQ PO TBCR
40.0000 meq | EXTENDED_RELEASE_TABLET | ORAL | Status: AC
  Administered 2020-08-21 (×2): 40 meq via ORAL
  Filled 2020-08-21 (×2): qty 2

## 2020-08-21 MED ORDER — INSULIN PEN NEEDLE 32G X 4 MM MISC: 0 refills | Status: DC

## 2020-08-21 MED ORDER — NOVOLIN 70/30 RELION (70-30) 100 UNIT/ML ~~LOC~~ SUSP: 30.0000 [IU] | Freq: Two times a day (BID) | SUBCUTANEOUS | 1 refills | Status: DC

## 2020-08-21 NOTE — Progress Notes (Signed)
08/21/2020 Spoke to Darel Hong the diabetes coordinator concerning patient insurance would not pay for the patient Lantus pen and Novolog pen. Diabetes coordinator was able to suggest patient change to Novolin 70/30, which would be affordable for the patient. [facility] RN.

## 2020-08-21 NOTE — Progress Notes (Signed)
Inpatient Diabetes Program Recommendations  AACE/ADA: New Consensus Statement on Inpatient Glycemic Control (2015)  Target Ranges:  Prepandial:   less than 140 mg/dL      Peak postprandial:   less than 180 mg/dL (1-2 hours)      Critically ill patients:  140 - 180 mg/dL   Lab Results  Component Value Date   GLUCAP 298 (H) 08/21/2020   HGBA1C 11.3 (H) 08/19/2020    Inpatient Diabetes Program Recommendations:   Spoke with Dr. Hanley Ben regarding changing insulin to more affordable Novolin [employer] brand insulin 70/30 30 units bid ac meals would = approximately 42 units basal + 18 units meal coverage.  Thank you, Billy Fischer. Raghav Verrilli, RN, MSN, CDE  Diabetes Coordinator Inpatient Glycemic Control Team Team Pager [phone] (8am-5pm) 08/21/2020 1:38 PM

## 2020-08-21 NOTE — Discharge Summary (Addendum)
Physician Discharge Summary  Crews Mccollam BPZ:[id number] DOB: 07-14-1995 DOA: 08/19/2020  PCP: Patient, No Pcp Per  Admit date: 08/19/2020 Discharge date: 08/21/2020  Admitted From: Home Disposition: Home  Recommendations for Outpatient Follow-up:  1. Follow up with PCP in 1 week with repeat CBC/BMP 2. Outpatient follow-up with endocrinology 3. Follow up in ED if symptoms worsen or new appear   Home Health: No Equipment/Devices: None  Discharge Condition: Stable CODE STATUS: Full Diet recommendation: Heart healthy/carb modified  Brief/Interim Summary: 25 year old male with history of diabetes mellitus type 2 was not been on any medications recently, morbid obesity and hypertension presented with weakness, nausea and vomiting.  On presentation, he was found to have DKA and was started on insulin drip.  Subsequently, his anion gap closed and he has been transitioned to long-acting insulin.  His blood sugars are on the higher side but stable.  He will be discharged today on long-acting and short-acting insulin.  Discharge Diagnoses:   Diabetic ketoacidosis in a patient with diabetes mellitus type 2 versus type I (unclear) not on any medications at home -Presented with DKA and was treated with IV fluids and insulin drip. -Subsequently, his anion gap closed and he has been transitioned to long-acting insulin -Follow A1c.  Diabetes coordinator evaluation appreciated.  Blood sugars have improved but still on the high side.  Currently tolerating carb modified diet. -Counseled regarding compliance -nutrition/dietary appreciated -Discharge patient on70/30 insulin 30 units BID with outpatient follow-up with PCP and endocrinology  Acute kidney injury  -most likely prerenal.  Resolved  Hypokalemia  -Replace prior to discharge.  Outpatient follow-up  Leukocytosis -Probably reactive.  Resolved  Mild hyponatremia -Mild.  Outpatient follow-up  Essential hypertension -Blood pressure  intermittently on the higher side.    Follow-up with PCP regarding restarting antihypertensives.  Cannabis abuse -Was counseled by admitting provider  Morbid obesity -Outpatient follow-up   Discharge Instructions  Discharge Instructions    Amb Referral to Nutrition and Diabetic Education   Complete by: As directed    Diet - low sodium heart healthy   Complete by: As directed    Diet Carb Modified   Complete by: As directed    Increase activity slowly   Complete by: As directed      Allergies as of 08/21/2020   No Known Allergies     Medication List    STOP taking these medications   insulin glargine 100 UNIT/ML injection Commonly known as: Lantus   insulin glargine 100 UNIT/ML Solostar Pen Commonly known as: Lantus SoloStar   lisinopril 10 MG tablet Commonly known as: ZESTRIL     TAKE these medications   blood glucose meter kit and supplies Dispense based on patient and insurance preference. Use up to four times daily as directed. (FOR ICD-10 E10.9, E11.9).   INSULIN SYRINGE .5CC/31GX5/16" 31G X 5/16" 0.5 ML Misc Novolog 70/30 30units BID   NovoLIN 70/30 ReliOn (70-30) 100 UNIT/ML injection Generic drug: insulin NPH-regular Human Inject 30 Units into the skin 2 (two) times daily with a meal.         Follow-up Information    PCP. Schedule an appointment as soon as possible for a visit in 1 week(s).   Why: With repeat CBC has BMP             No Known Allergies  Consultations:  None   Procedures/Studies:  No results found.    Subjective: Patient seen and examined at bedside.  He feels better.  Feels okay going home.  Denies any overnight fever, vomiting, abdominal pain.  Tolerating diet.  Discharge Exam: Vitals:   08/20/20 2032 08/21/20 0440  BP: (!) 147/92 (!) 145/90  Pulse: 99 91  Resp: 16 18  Temp: 98 F (36.7 C) 97.9 F (36.6 C)  SpO2: 100% 97%    General: Pt is alert, awake, not in acute distress.  Poor historian.   Currently on room air. Cardiovascular: rate controlled, S1/S2 + Respiratory: bilateral decreased breath sounds at bases Abdominal: Soft, morbidly obese, NT, ND, bowel sounds + Extremities: Trace lower extremity edema; no cyanosis    The results of significant diagnostics from this hospitalization (including imaging, microbiology, ancillary and laboratory) are listed below for reference.     Microbiology: Recent Results (from the past 240 hour(s))  SARS CORONAVIRUS 2 (TAT 6-24 HRS) Nasopharyngeal Urine, Clean Catch     Status: None   Collection Time: 08/19/20  5:46 PM   Specimen: Urine, Clean Catch; Nasopharyngeal  Result Value Ref Range Status   SARS Coronavirus 2 NEGATIVE NEGATIVE Final    Comment: (NOTE) SARS-CoV-2 target nucleic acids are NOT DETECTED.  The SARS-CoV-2 RNA is generally detectable in upper and lower respiratory specimens during the acute phase of infection. Negative results do not preclude SARS-CoV-2 infection, do not rule out co-infections with other pathogens, and should not be used as the sole basis for treatment or other patient management decisions. Negative results must be combined with clinical observations, patient history, and epidemiological information. The expected result is Negative.  Fact Sheet for Patients: SugarRoll.be  Fact Sheet for Healthcare Providers: https://www.woods-mathews.com/  This test is not yet approved or cleared by the [place] FDA and  has been authorized for detection and/or diagnosis of SARS-CoV-2 by FDA under an Emergency Use Authorization (EUA). This EUA will remain  in effect (meaning this test can be used) for the duration of the COVID-19 declaration under Se ction 564(b)(1) of the Act, 21 U.S.C. section 360bbb-3(b)(1), unless the authorization is terminated or revoked sooner.  Performed at [facility], [place] [address] [place], [place] [address]      Labs: BNP  (last 3 results) No results for input(s): BNP in the last 8760 hours. Basic Metabolic Panel: Recent Labs  Lab 08/19/20 1638 08/19/20 1818 08/19/20 1915 08/20/20 0148 08/20/20 0530 08/21/20 0501  NA 133* 138 134* 137 136 132*  K 3.6 4.0 3.7 3.1* 3.1* 3.0*  CL 100  --  102 104 104 100  CO2 14*  --  15* 21* 20* 20*  GLUCOSE 443*  --  261* 174* 213* 264*  BUN 9  --  '8 6 6 ' 5*  CREATININE 1.55*  --  1.19 1.01 1.00 0.97  CALCIUM 10.3  --  9.7 9.6 9.6 9.2  MG  --   --   --   --   --  1.8   Liver Function Tests: Recent Labs  Lab 08/19/20 1638  AST 58*  ALT 94*  ALKPHOS 80  BILITOT 2.7*  PROT 8.7*  ALBUMIN 4.7   Recent Labs  Lab 08/19/20 1638  LIPASE 32   No results for input(s): AMMONIA in the last 168 hours. CBC: Recent Labs  Lab 08/19/20 1638 08/19/20 1818 08/20/20 0148 08/21/20 0501  WBC 15.7*  --  12.8* 9.6  NEUTROABS  --   --   --  4.5  HGB 16.5 15.6 14.1 13.9  HCT 47.3 46.0 40.0 40.0  MCV 82.7  --  83.2 83.5  PLT 345  --  251 230   Cardiac Enzymes: No results for input(s): CKTOTAL, CKMB, CKMBINDEX, TROPONINI in the last 168 hours. BNP: Invalid input(s): POCBNP CBG: Recent Labs  Lab 08/20/20 1154 08/20/20 1612 08/20/20 2100 08/21/20 0800 08/21/20 0947  GLUCAP 263* 324* 263* 280* 298*   D-Dimer No results for input(s): DDIMER in the last 72 hours. Hgb A1c Recent Labs    08/19/20 1915  HGBA1C 11.3*   Lipid Profile No results for input(s): CHOL, HDL, LDLCALC, TRIG, CHOLHDL, LDLDIRECT in the last 72 hours. Thyroid function studies No results for input(s): TSH, T4TOTAL, T3FREE, THYROIDAB in the last 72 hours.  Invalid input(s): FREET3 Anemia work up No results for input(s): VITAMINB12, FOLATE, FERRITIN, TIBC, IRON, RETICCTPCT in the last 72 hours. Urinalysis    Component Value Date/Time   COLORURINE YELLOW 08/19/2020 1832   APPEARANCEUR CLEAR 08/19/2020 1832   LABSPEC 1.028 08/19/2020 1832   PHURINE 5.0 08/19/2020 1832   GLUCOSEU >=500  (A) 08/19/2020 1832   HGBUR SMALL (A) 08/19/2020 1832   BILIRUBINUR NEGATIVE 08/19/2020 1832   KETONESUR 80 (A) 08/19/2020 1832   PROTEINUR 30 (A) 08/19/2020 1832   NITRITE NEGATIVE 08/19/2020 1832   LEUKOCYTESUR NEGATIVE 08/19/2020 1832   Sepsis Labs Invalid input(s): PROCALCITONIN,  WBC,  LACTICIDVEN Microbiology Recent Results (from the past 240 hour(s))  SARS CORONAVIRUS 2 (TAT 6-24 HRS) Nasopharyngeal Urine, Clean Catch     Status: None   Collection Time: 08/19/20  5:46 PM   Specimen: Urine, Clean Catch; Nasopharyngeal  Result Value Ref Range Status   SARS Coronavirus 2 NEGATIVE NEGATIVE Final    Comment: (NOTE) SARS-CoV-2 target nucleic acids are NOT DETECTED.  The SARS-CoV-2 RNA is generally detectable in upper and lower respiratory specimens during the acute phase of infection. Negative results do not preclude SARS-CoV-2 infection, do not rule out co-infections with other pathogens, and should not be used as the sole basis for treatment or other patient management decisions. Negative results must be combined with clinical observations, patient history, and epidemiological information. The expected result is Negative.  Fact Sheet for Patients: SugarRoll.be  Fact Sheet for Healthcare Providers: https://www.woods-mathews.com/  This test is not yet approved or cleared by the [place] FDA and  has been authorized for detection and/or diagnosis of SARS-CoV-2 by FDA under an Emergency Use Authorization (EUA). This EUA will remain  in effect (meaning this test can be used) for the duration of the COVID-19 declaration under Se ction 564(b)(1) of the Act, 21 U.S.C. section 360bbb-3(b)(1), unless the authorization is terminated or revoked sooner.  Performed at [facility], [place] [address] [place], [place] [address]      Time coordinating discharge: 35 minutes  SIGNED:   Aline August, MD  [facility] 08/21/2020, 10:05 AM

## 2024-01-16 ENCOUNTER — Emergency Department (HOSPITAL_COMMUNITY): Payer: Self-pay

## 2024-01-16 ENCOUNTER — Emergency Department (HOSPITAL_COMMUNITY)
Admission: EM | Admit: 2024-01-16 | Discharge: 2024-01-16 | Disposition: A | Discharge status: Home / Self Care | Payer: Self-pay | Attending: Emergency Medicine | Admitting: Emergency Medicine

## 2024-01-16 ENCOUNTER — Other Ambulatory Visit: Payer: Self-pay

## 2024-01-16 DIAGNOSIS — Z79899 Other long term (current) drug therapy: Secondary | ICD-10-CM | POA: Insufficient documentation

## 2024-01-16 DIAGNOSIS — Z7984 Long term (current) use of oral hypoglycemic drugs: Secondary | ICD-10-CM | POA: Insufficient documentation

## 2024-01-16 DIAGNOSIS — R079 Chest pain, unspecified: Secondary | ICD-10-CM

## 2024-01-16 DIAGNOSIS — E119 Type 2 diabetes mellitus without complications: Secondary | ICD-10-CM | POA: Insufficient documentation

## 2024-01-16 DIAGNOSIS — Z794 Long term (current) use of insulin: Secondary | ICD-10-CM | POA: Insufficient documentation

## 2024-01-16 DIAGNOSIS — I10 Essential (primary) hypertension: Secondary | ICD-10-CM | POA: Insufficient documentation

## 2024-01-16 LAB — BASIC METABOLIC PANEL WITH GFR
Anion gap: 13 (ref 5–15)
BUN: 9 mg/dL (ref 6–20)
CO2: 19 mmol/L — ABNORMAL LOW (ref 22–32)
Calcium: 9.9 mg/dL (ref 8.9–10.3)
Chloride: 106 mmol/L (ref 98–111)
Creatinine, Ser: 0.99 mg/dL (ref 0.61–1.24)
GFR, Estimated: >60 mL/min (ref >60)
Glucose, Bld: 155 mg/dL — ABNORMAL HIGH (ref 70–99)
Potassium: 3.7 mmol/L (ref 3.5–5.1)
Sodium: 138 mmol/L (ref 135–145)

## 2024-01-16 LAB — CBC
HCT: 45.5 % (ref 39.0–52.0)
Hemoglobin: 15.3 g/dL (ref 13.0–17.0)
MCH: 29.4 pg (ref 26.0–34.0)
MCHC: 33.6 g/dL (ref 30.0–36.0)
MCV: 87.3 fL (ref 80.0–100.0)
Platelets: 309 K/uL (ref 150–400)
RBC: 5.21 MIL/uL (ref 4.22–5.81)
RDW: 11.7 % (ref 11.5–15.5)
WBC: 15.7 K/uL — ABNORMAL HIGH (ref 4.0–10.5)
nRBC: 0 % (ref 0.0–0.2)

## 2024-01-16 LAB — D-DIMER, QUANTITATIVE: D-Dimer, Quant: <0.27 ug{FEU}/mL (ref 0.00–0.50)

## 2024-01-16 LAB — TROPONIN I (HIGH SENSITIVITY)
Troponin I (High Sensitivity): 16 ng/L (ref <18)
Troponin I (High Sensitivity): 12 ng/L (ref <18)

## 2024-01-16 MED ORDER — METFORMIN HCL ER 750 MG PO TB24: 750.0000 mg | ORAL_TABLET | Freq: Every day | ORAL | 3 refills | Status: DC

## 2024-01-16 MED ORDER — AMLODIPINE BESYLATE 5 MG PO TABS: 5.0000 mg | ORAL_TABLET | Freq: Every day | ORAL | 3 refills | Status: DC

## 2024-01-16 MED ORDER — LACTATED RINGERS IV BOLUS
2000.0000 mL | Freq: Once | INTRAVENOUS | Status: AC
  Administered 2024-01-16: 1000 mL via INTRAVENOUS

## 2024-01-16 NOTE — ED Triage Notes (Signed)
 Elevated bp and chest pain for the past week.

## 2024-01-16 NOTE — Discharge Instructions (Addendum)
 Please establish care with a primary care provider.  There is a hotline number in this paperwork if you need help finding 1.  Try pepcid  or tagamet up to twice a day.  Try to avoid things that may make this worse, most commonly these are spicy foods tomato based products fatty foods chocolate and peppermint.  Alcohol and tobacco can also make this worse.  Return to the emergency department for sudden worsening pain fever or inability to eat or drink.

## 2024-01-16 NOTE — ED Triage Notes (Signed)
 Chest pain right sided, does not radiate, nothing makes better or worse, going on for 1 week. Some SOB.

## 2024-01-16 NOTE — ED Provider Notes (Signed)
 [place] EMERGENCY DEPARTMENT AT [facility] Provider Note   CSN: [id number] Arrival date & time: 01/16/24  2043     Patient presents with: Chest Pain   Erik Harper is a 28 y.o. male.   28 yo M with a chief complaints of chest pain.  Left-sided seems to come and go.  Started on Sunday, about 3 days ago.  Was pretty significant then but has resolved.  He went to the gym yesterday and feels pretty tired but had no ongoing chest pain.  Came here for evaluation.  He has a history of hypertension and diabetes and hyperlipidemia but has not seen a doctor in a long time and is not on any medications.  He does smoke every day.  Denies family history of MI.  Denies history of PE or DVT denies hemoptysis denies unilateral lower extremity edema denies recent surgery immobilization or hospitalization.  Denies history of cancer.   Chest Pain      Prior to Admission medications   Medication Sig Start Date End Date Taking? Authorizing Provider  amLODipine  (NORVASC ) 5 MG tablet Take 1 tablet (5 mg total) by mouth daily. 01/16/24  Yes Emil Share, DO  metFORMIN  (GLUCOPHAGE -XR) 750 MG 24 hr tablet Take 1 tablet (750 mg total) by mouth daily with breakfast. 01/16/24  Yes Emil Share, DO  blood glucose meter kit and supplies Dispense based on patient and insurance preference. Use up to four times daily as directed. (FOR ICD-10 E10.9, E11.9). 08/21/20   Cheryle Page, MD  insulin  NPH-regular Human (NOVOLIN  70/30 RELION) (70-30) 100 UNIT/ML injection Inject 30 Units into the skin 2 (two) times daily with a meal. Patient not taking: Reported on 01/16/2024 08/21/20   Cheryle Page, MD  Insulin  Syringe-Needle U-100 (INSULIN  SYRINGE .5CC/31GX5/16) 31G X 5/16 0.5 ML MISC Novolog  70/30 30units BID 08/21/20   Cheryle Page, MD    Allergies: Patient has no known allergies.    Review of Systems  Cardiovascular:  Positive for chest pain.    Updated Vital Signs BP 135/85 (BP Location: Left Arm)    Pulse 89   Temp 98.7 F (37.1 C)   Resp 15   Ht 6' (1.829 m)   Wt (!) 143.3 kg   SpO2 100%   BMI 42.85 kg/m   Physical Exam Vitals and nursing note reviewed.  Constitutional:      Appearance: He is well-developed.  HENT:     Head: Normocephalic and atraumatic.  Eyes:     Pupils: Pupils are equal, round, and reactive to light.  Neck:     Vascular: No JVD.  Cardiovascular:     Rate and Rhythm: Normal rate and regular rhythm.     Heart sounds: No murmur heard.    No friction rub. No gallop.  Pulmonary:     Effort: No respiratory distress.     Breath sounds: No wheezing.  Abdominal:     General: There is no distension.     Tenderness: There is no abdominal tenderness. There is no guarding or rebound.  Musculoskeletal:        General: Normal range of motion.     Cervical back: Normal range of motion and neck supple.  Skin:    Coloration: Skin is not pale.     Findings: No rash.  Neurological:     Mental Status: He is alert and oriented to person, place, and time.  Psychiatric:        Behavior: Behavior normal.     (  all labs ordered are listed, but only abnormal results are displayed) Labs Reviewed  BASIC METABOLIC PANEL WITH GFR - Abnormal; Notable for the following components:      Result Value   CO2 19 (*)    Glucose, Bld 155 (*)    All other components within normal limits  CBC - Abnormal; Notable for the following components:   WBC 15.7 (*)    All other components within normal limits  D-DIMER, QUANTITATIVE  TROPONIN I (HIGH SENSITIVITY)  TROPONIN I (HIGH SENSITIVITY)    EKG: EKG Interpretation Date/Time:  Thursday January 16 2024 20:52:02 EDT Ventricular Rate:  131 PR Interval:  128 QRS Duration:  70 QT Interval:  300 QTC Calculation: 443 R Axis:   38  Text Interpretation: Sinus tachycardia Possible Lateral infarct , age undetermined Possible Inferior infarct , age undetermined Abnormal ECG No significant change since last tracing Confirmed by Emil Share [phone]) on 01/16/2024 9:18:00 PM  Radiology: [facility] Chest 2 View Result Date: 01/16/2024 CLINICAL DATA:  Chest pain. EXAM: CHEST - 2 VIEW COMPARISON:  Chest radiograph dated 05/21/2018. FINDINGS: The heart size and mediastinal contours are within normal limits. Both lungs are clear. The visualized skeletal structures are unremarkable. IMPRESSION: No active cardiopulmonary disease. Electronically Signed   By: Vanetta Chou M.D.   On: 01/16/2024 21:10     Procedures   Medications Ordered in the ED  lactated ringers  bolus 2,000 mL (1,000 mLs Intravenous New Bag/Given 01/16/24 2255)                                    Medical Decision Making Amount and/or Complexity of Data Reviewed Labs: ordered. Radiology: ordered.  Risk Prescription drug management.   28 yo M with a cc of chest pain.  Going on for the past few days.  Patient hx of DM with repeat presentations for DKA.  Not on meds.    Troponin negative.  Patient with a mild metabolic acidosis with a bicarb of 19.  Otherwise no significant electrolyte abnormalities.  He is quite tachycardic on arrival with a heart rate in the 140s.  Will give IV fluids here.  D-dimer.  Reassess.  D-dimer negative.  Patient's child was picked up and his heart rate improved significantly and his blood pressure also came down.  I wonder if those are stress related.  He did feel like getting a bag of IV fluid.  Will have him follow-up with his PCP.  Per protocol we will treat for his blood pressure.  He should be on metformin  with his history of diabetes requiring admission for diabetic ketoacidosis.  11:11 PM:  I have discussed the diagnosis/risks/treatment options with the patient.  Evaluation and diagnostic testing in the emergency department does not suggest an emergent condition requiring admission or immediate intervention beyond what has been performed at this time.  They will follow up with PCP. We also discussed returning to the ED immediately if new  or worsening sx occur. We discussed the sx which are most concerning (e.g., sudden worsening pain, fever, inability to tolerate by mouth) that necessitate immediate return. Medications administered to the patient during their visit and any new prescriptions provided to the patient are listed below.  Medications given during this visit Medications  lactated ringers  bolus 2,000 mL (1,000 mLs Intravenous New Bag/Given 01/16/24 2255)     The patient appears reasonably screen and/or stabilized for discharge and I  doubt any other medical condition or other [facility] requiring further screening, evaluation, or treatment in the ED at this time prior to discharge.       Final diagnoses:  Nonspecific chest pain  Uncontrolled hypertension  Type 2 diabetes mellitus without complication, without long-term current use of insulin  [facility])    ED Discharge Orders          Ordered    amLODipine  (NORVASC ) 5 MG tablet  Daily        01/16/24 2256    metFORMIN  (GLUCOPHAGE -XR) 750 MG 24 hr tablet  Daily with breakfast        01/16/24 2256               Emil Share, DO 01/16/24 2311

## 2024-05-19 ENCOUNTER — Other Ambulatory Visit: Payer: Self-pay

## 2024-05-19 ENCOUNTER — Inpatient Hospital Stay (HOSPITAL_COMMUNITY)
Admission: EM | Admit: 2024-05-19 | Discharge: 2024-05-22 | DRG: 637 | Disposition: A | Discharge status: Home / Self Care | Attending: Internal Medicine | Admitting: Internal Medicine

## 2024-05-19 DIAGNOSIS — Z7984 Long term (current) use of oral hypoglycemic drugs: Secondary | ICD-10-CM

## 2024-05-19 DIAGNOSIS — Z6841 Body Mass Index (BMI) 40.0 and over, adult: Secondary | ICD-10-CM

## 2024-05-19 DIAGNOSIS — E876 Hypokalemia: Secondary | ICD-10-CM | POA: Diagnosis not present

## 2024-05-19 DIAGNOSIS — Z91199 Patient's noncompliance with other medical treatment and regimen due to unspecified reason: Secondary | ICD-10-CM

## 2024-05-19 DIAGNOSIS — Z1152 Encounter for screening for COVID-19: Secondary | ICD-10-CM

## 2024-05-19 DIAGNOSIS — Z79899 Other long term (current) drug therapy: Secondary | ICD-10-CM

## 2024-05-19 DIAGNOSIS — R6511 Systemic inflammatory response syndrome (SIRS) of non-infectious origin with acute organ dysfunction: Secondary | ICD-10-CM | POA: Diagnosis present

## 2024-05-19 DIAGNOSIS — Z833 Family history of diabetes mellitus: Secondary | ICD-10-CM

## 2024-05-19 DIAGNOSIS — N179 Acute kidney failure, unspecified: Secondary | ICD-10-CM | POA: Diagnosis present

## 2024-05-19 DIAGNOSIS — E66813 Obesity, class 3: Secondary | ICD-10-CM | POA: Diagnosis present

## 2024-05-19 DIAGNOSIS — F1290 Cannabis use, unspecified, uncomplicated: Secondary | ICD-10-CM | POA: Diagnosis present

## 2024-05-19 DIAGNOSIS — Z794 Long term (current) use of insulin: Secondary | ICD-10-CM

## 2024-05-19 DIAGNOSIS — E1110 Type 2 diabetes mellitus with ketoacidosis without coma: Principal | ICD-10-CM | POA: Diagnosis present

## 2024-05-19 DIAGNOSIS — I10 Essential (primary) hypertension: Secondary | ICD-10-CM | POA: Diagnosis present

## 2024-05-19 LAB — CBC
HCT: 47.8 % (ref 39.0–52.0)
Hemoglobin: 16.3 g/dL (ref 13.0–17.0)
MCH: 29 pg (ref 26.0–34.0)
MCHC: 34.1 g/dL (ref 30.0–36.0)
MCV: 85.1 fL (ref 80.0–100.0)
Platelets: 331 K/uL (ref 150–400)
RBC: 5.62 MIL/uL (ref 4.22–5.81)
RDW: 11.9 % (ref 11.5–15.5)
WBC: 23.5 K/uL — ABNORMAL HIGH (ref 4.0–10.5)
nRBC: 0 % (ref 0.0–0.2)

## 2024-05-19 LAB — I-STAT CHEM 8, ED
BUN: 17 mg/dL (ref 6–20)
Calcium, Ion: 1.24 mmol/L (ref 1.15–1.40)
Chloride: 102 mmol/L (ref 98–111)
Creatinine, Ser: 1.2 mg/dL (ref 0.61–1.24)
Glucose, Bld: 440 mg/dL — ABNORMAL HIGH (ref 70–99)
HCT: 50 % (ref 39.0–52.0)
Hemoglobin: 17 g/dL (ref 13.0–17.0)
Potassium: 4.3 mmol/L (ref 3.5–5.1)
Sodium: 136 mmol/L (ref 135–145)
TCO2: 16 mmol/L — ABNORMAL LOW (ref 22–32)

## 2024-05-19 LAB — COMPREHENSIVE METABOLIC PANEL WITH GFR
ALT: 42 U/L (ref 0–44)
AST: 32 U/L (ref 15–41)
Albumin: 4.7 g/dL (ref 3.5–5.0)
Alkaline Phosphatase: 61 U/L (ref 38–126)
Anion gap: 21 — ABNORMAL HIGH (ref 5–15)
BUN: 13 mg/dL (ref 6–20)
CO2: 16 mmol/L — ABNORMAL LOW (ref 22–32)
Calcium: 10 mg/dL (ref 8.9–10.3)
Chloride: 97 mmol/L — ABNORMAL LOW (ref 98–111)
Creatinine, Ser: 1.41 mg/dL — ABNORMAL HIGH (ref 0.61–1.24)
GFR, Estimated: >60 mL/min (ref >60)
Glucose, Bld: 435 mg/dL — ABNORMAL HIGH (ref 70–99)
Potassium: 4.6 mmol/L (ref 3.5–5.1)
Sodium: 134 mmol/L — ABNORMAL LOW (ref 135–145)
Total Bilirubin: 2.6 mg/dL — ABNORMAL HIGH (ref 0.0–1.2)
Total Protein: 9.1 g/dL — ABNORMAL HIGH (ref 6.5–8.1)

## 2024-05-19 LAB — I-STAT VENOUS BLOOD GAS, ED
Acid-base deficit: 8 mmol/L — ABNORMAL HIGH (ref 0.0–2.0)
Bicarbonate: 15.7 mmol/L — ABNORMAL LOW (ref 20.0–28.0)
Calcium, Ion: 1.22 mmol/L (ref 1.15–1.40)
HCT: 49 % (ref 39.0–52.0)
Hemoglobin: 16.7 g/dL (ref 13.0–17.0)
O2 Saturation: 91 %
Potassium: 4.3 mmol/L (ref 3.5–5.1)
Sodium: 134 mmol/L — ABNORMAL LOW (ref 135–145)
TCO2: 17 mmol/L — ABNORMAL LOW (ref 22–32)
pCO2, Ven: 29.1 mmHg — ABNORMAL LOW (ref 44–60)
pH, Ven: 7.342 (ref 7.25–7.43)
pO2, Ven: 64 mmHg — ABNORMAL HIGH (ref 32–45)

## 2024-05-19 LAB — LIPASE, BLOOD: Lipase: 42 U/L (ref 11–51)

## 2024-05-19 LAB — DIFFERENTIAL
Abs Immature Granulocytes: 0.15 K/uL — ABNORMAL HIGH (ref 0.00–0.07)
Basophils Absolute: 0.1 K/uL (ref 0.0–0.1)
Basophils Relative: 1 %
Eosinophils Absolute: 0 K/uL (ref 0.0–0.5)
Eosinophils Relative: 0 %
Immature Granulocytes: 1 %
Lymphocytes Relative: 13 %
Lymphs Abs: 3.1 K/uL (ref 0.7–4.0)
Monocytes Absolute: 1.7 K/uL — ABNORMAL HIGH (ref 0.1–1.0)
Monocytes Relative: 7 %
Neutro Abs: 18.3 K/uL — ABNORMAL HIGH (ref 1.7–7.7)
Neutrophils Relative %: 78 %

## 2024-05-19 LAB — CBG MONITORING, ED: Glucose-Capillary: 420 mg/dL — ABNORMAL HIGH (ref 70–99)

## 2024-05-19 MED ORDER — ONDANSETRON HCL 4 MG/2ML IJ SOLN
4.0000 mg | Freq: Once | INTRAMUSCULAR | Status: AC
  Administered 2024-05-19: 4 mg via INTRAVENOUS
  Filled 2024-05-19: qty 2

## 2024-05-19 MED ORDER — POTASSIUM CHLORIDE 10 MEQ/100ML IV SOLN
10.0000 meq | INTRAVENOUS | Status: AC
  Administered 2024-05-20: 10 meq via INTRAVENOUS
  Filled 2024-05-19: qty 100

## 2024-05-19 MED ORDER — INSULIN REGULAR(HUMAN) IN NACL 100-0.9 UT/100ML-% IV SOLN
INTRAVENOUS | Status: DC
  Administered 2024-05-20: 6.5 [IU]/h via INTRAVENOUS
  Administered 2024-05-20: 15 [IU]/h via INTRAVENOUS
  Administered 2024-05-20: 13 [IU]/h via INTRAVENOUS
  Filled 2024-05-19 (×3): qty 100

## 2024-05-19 MED ORDER — LACTATED RINGERS IV BOLUS
20.0000 mL/kg | Freq: Once | INTRAVENOUS | Status: AC
  Administered 2024-05-19: 2994 mL via INTRAVENOUS

## 2024-05-19 MED ORDER — LACTATED RINGERS IV SOLN: INTRAVENOUS | Status: AC

## 2024-05-19 MED ORDER — DEXTROSE IN LACTATED RINGERS 5 % IV SOLN: INTRAVENOUS | Status: AC

## 2024-05-19 MED ORDER — DEXTROSE 50 % IV SOLN: 0.0000 mL | INTRAVENOUS | Status: DC | PRN

## 2024-05-19 NOTE — ED Triage Notes (Signed)
 Pt reports n/v for 2 days and hyperglycemia. Pt is type 2 diabetic and compliant with home meds.

## 2024-05-19 NOTE — ED Provider Notes (Signed)
 [place] EMERGENCY DEPARTMENT AT [facility] Provider Note   CSN: [id number] Arrival date & time: 05/19/24  2230     Patient presents with: Emesis   Erik Harper is a 28 y.o. male.  Emesis Patient is a 28 year old male presenting ED today for concerns for nausea, vomiting x 2 days with previous medical history of type 2 diabetes.  Last admission for DKA was in 2022.  Reports that he has not been taking any of his medications for the last few days but was compliant until he was sick with nausea and vomiting.  Has not been using insulin  for several years now.  Reporting approximately 7-10 vomiting episodes per day, nonbilious, nonbloody.  Able to tolerate secretions but vomits every time he attempts to hydrate.  Has not eaten food x 2 days.  Reports abdominal cramping without abdominal pain.  Denies alcohol use, endorses THC use.  Denies fever, headache, vision changes, chest pain, hematemesis, cough, congestion, diarrhea, melena, hematochezia, dysuria, discharge, lower leg swelling, rashes.     Prior to Admission medications   Medication Sig Start Date End Date Taking? Authorizing Provider  amLODipine  (NORVASC ) 5 MG tablet Take 1 tablet (5 mg total) by mouth daily. 01/16/24   Emil Share, DO  blood glucose meter kit and supplies Dispense based on patient and insurance preference. Use up to four times daily as directed. (FOR ICD-10 E10.9, E11.9). 08/21/20   Cheryle Page, MD  insulin  NPH-regular Human (NOVOLIN  70/30 RELION) (70-30) 100 UNIT/ML injection Inject 30 Units into the skin 2 (two) times daily with a meal. Patient not taking: Reported on 01/16/2024 08/21/20   Cheryle Page, MD  Insulin  Syringe-Needle U-100 (INSULIN  SYRINGE .5CC/31GX5/16) 31G X 5/16 0.5 ML MISC Novolog  70/30 30units BID 08/21/20   Cheryle Page, MD  metFORMIN  (GLUCOPHAGE -XR) 750 MG 24 hr tablet Take 1 tablet (750 mg total) by mouth daily with breakfast. 01/16/24   Emil Share, DO    Allergies: Patient  has no known allergies.    Review of Systems  Gastrointestinal:  Positive for nausea and vomiting.  All other systems reviewed and are negative.   Updated Vital Signs BP 136/75   Pulse (!) 112   Temp 99.2 F (37.3 C)   Resp 18   Ht 6' (1.829 m)   Wt (!) 149.7 kg   SpO2 100%   BMI 44.76 kg/m   Physical Exam Vitals and nursing note reviewed.  Constitutional:      General: He is not in acute distress.    Appearance: Normal appearance. He is not ill-appearing or diaphoretic.  HENT:     Head: Normocephalic and atraumatic.  Eyes:     General: No scleral icterus.       Right eye: No discharge.        Left eye: No discharge.     Extraocular Movements: Extraocular movements intact.     Conjunctiva/sclera: Conjunctivae normal.  Cardiovascular:     Rate and Rhythm: Regular rhythm. Tachycardia present.     Pulses: Normal pulses.     Heart sounds: Normal heart sounds. No murmur heard.    No friction rub. No gallop.  Pulmonary:     Effort: Pulmonary effort is normal. No respiratory distress.     Breath sounds: No stridor. No wheezing, rhonchi or rales.  Chest:     Chest wall: No tenderness.  Abdominal:     General: Abdomen is flat. There is no distension.     Palpations: Abdomen is soft.  Tenderness: There is no abdominal tenderness. There is no right CVA tenderness, left CVA tenderness, guarding or rebound.  Musculoskeletal:        General: No swelling, deformity or signs of injury.     Cervical back: Normal range of motion. No rigidity.     Right lower leg: No edema.     Left lower leg: No edema.  Skin:    General: Skin is warm and dry.     Findings: No bruising, erythema or lesion.  Neurological:     General: No focal deficit present.     Mental Status: He is alert and oriented to person, place, and time. Mental status is at baseline.     Sensory: No sensory deficit.     Motor: No weakness.  Psychiatric:        Mood and Affect: Mood normal.     (all labs  ordered are listed, but only abnormal results are displayed) Labs Reviewed  COMPREHENSIVE METABOLIC PANEL WITH GFR - Abnormal; Notable for the following components:      Result Value   Sodium 134 (*)    Chloride 97 (*)    CO2 16 (*)    Glucose, Bld 435 (*)    Creatinine, Ser 1.41 (*)    Total Protein 9.1 (*)    Total Bilirubin 2.6 (*)    Anion gap 21 (*)    All other components within normal limits  CBC - Abnormal; Notable for the following components:   WBC 23.5 (*)    All other components within normal limits  URINALYSIS, ROUTINE W REFLEX MICROSCOPIC - Abnormal; Notable for the following components:   Glucose, UA >=500 (*)    Hgb urine dipstick MODERATE (*)    Ketones, ur 80 (*)    Protein, ur 100 (*)    All other components within normal limits  BETA-HYDROXYBUTYRIC ACID - Abnormal; Notable for the following components:   Beta-Hydroxybutyric Acid 5.94 (*)    All other components within normal limits  RAPID URINE DRUG SCREEN, HOSP PERFORMED - Abnormal; Notable for the following components:   Tetrahydrocannabinol POSITIVE (*)    All other components within normal limits  DIFFERENTIAL - Abnormal; Notable for the following components:   Neutro Abs 18.3 (*)    Monocytes Absolute 1.7 (*)    Abs Immature Granulocytes 0.15 (*)    All other components within normal limits  CBG MONITORING, ED - Abnormal; Notable for the following components:   Glucose-Capillary 420 (*)    All other components within normal limits  I-STAT VENOUS BLOOD GAS, ED - Abnormal; Notable for the following components:   pCO2, Ven 29.1 (*)    pO2, Ven 64 (*)    Bicarbonate 15.7 (*)    TCO2 17 (*)    Acid-base deficit 8.0 (*)    Sodium 134 (*)    All other components within normal limits  I-STAT CHEM 8, ED - Abnormal; Notable for the following components:   Glucose, Bld 440 (*)    TCO2 16 (*)    All other components within normal limits  CBG MONITORING, ED - Abnormal; Notable for the following components:    Glucose-Capillary 413 (*)    All other components within normal limits  CBG MONITORING, ED - Abnormal; Notable for the following components:   Glucose-Capillary 316 (*)    All other components within normal limits  CBG MONITORING, ED - Abnormal; Notable for the following components:   Glucose-Capillary 278 (*)    All  other components within normal limits  RESP PANEL BY RT-PCR (RSV, FLU A&B, COVID)  RVPGX2  LIPASE, BLOOD  BETA-HYDROXYBUTYRIC ACID  BETA-HYDROXYBUTYRIC ACID  BETA-HYDROXYBUTYRIC ACID  BETA-HYDROXYBUTYRIC ACID    EKG: EKG Interpretation Date/Time:  Tuesday May 19 2024 22:44:19 EST Ventricular Rate:  144 PR Interval:  124 QRS Duration:  76 QT Interval:  278 QTC Calculation: 430 R Axis:   72  Text Interpretation: Sinus tachycardia Otherwise normal ECG When compared with ECG of 16-Jan-2024 20:52, No significant change was found Confirmed by Raford Lenis ([id number]) on 05/19/2024 11:27:22 PM  Radiology: No results found. .Critical Care  Performed by: Beola Terrall RAMAN, PA-C Authorized by: Beola Terrall RAMAN, PA-C   Critical care provider statement:    Critical care time (minutes):  30   Critical care time was exclusive of:  Separately billable procedures and treating other patients   Critical care was necessary to treat or prevent imminent or life-threatening deterioration of the following conditions:  Endocrine crisis   Critical care was time spent personally by me on the following activities:  Development of treatment plan with patient or surrogate, discussions with consultants, evaluation of patient's response to treatment, examination of patient, ordering and review of laboratory studies, ordering and review of radiographic studies, ordering and performing treatments and interventions, pulse oximetry, re-evaluation of patient's condition, review of old charts and obtaining history from patient or surrogate   I assumed direction of critical care for this patient from  another provider in my specialty: yes     Care discussed with: admitting provider      Medications Ordered in the ED  insulin  regular, human (MYXREDLIN ) 100 units/ 100 mL infusion (12 Units/hr Intravenous Rate/Dose Change 05/20/24 0212)  dextrose  5 % in lactated ringers  infusion (has no administration in time range)  dextrose  50 % solution 0-50 mL (has no administration in time range)  potassium chloride  10 mEq in 100 mL IVPB (10 mEq Intravenous New Bag/Given 05/20/24 0019)  lactated ringers  infusion (has no administration in time range)  lactated ringers  bolus 2,994 mL (2,994 mLs Intravenous New Bag/Given 05/19/24 2344)  ondansetron  (ZOFRAN ) injection 4 mg (4 mg Intravenous Given 05/19/24 2346)     Medical Decision Making Amount and/or Complexity of Data Reviewed Labs: ordered.  Risk Prescription drug management. Decision regarding hospitalization.   This patient is a 28 year old male who presents to the ED for concern of nausea, vomiting x 2 days.  Notably has not been taking insulin  for several years, had been taking metformin  until he had recently started developing nausea and vomiting.  Tolerating secretions but every time he attempts to hydrate has persistently vomited.  Nonbloody, nonbilious.  No diarrhea.  On physical exam, patient is in no acute distress, afebrile, alert and orient x 4, speaking in full sentences, nontachypneic.  Notably is tachycardic with heart rate in 140s.  LCTAB, no murmur, no lower leg edema.  No abdominal tenderness noted to palpation, negative Murphy's, negative Rovsing's, negative McBurney's point.  No CVA tenderness.  Unremarkable exam otherwise.  The patient notably having elevated blood sugar of 420, initiated DKA workup and treatment.  Notably having elevated anion gap, elevated hydroxybutyrate acid.    On reevaluation, after infusion of LR and insulin , patient's tachycardia had improved to 110s.  Nausea having improved with no recurrent vomiting,  patient resting comfortably.  With patient notably in DKA, will seek to admit.  Attending Dr. Raford also saw patient.  Spoke with hospitalist, Dr. Charlton who accepted patient care at  this time.  Differential diagnoses prior to evaluation: The emergent differential diagnosis includes, but is not limited to, DKA, hyperglycemia, gastritis, dehydration,. This is not an exhaustive differential.   Past Medical History / Co-morbidities / Social History: 30 diabetes  Additional history: Chart reviewed. Pertinent results include:   Last seen in the emergency department 01/16/2024 for chest pain.  Noted to have arrived tachycardic with heart rate in 140s, with mild metabolic acidosis and bicarb 19.  Negative workup at that time, provided amlodipine  and metformin .  Last admission for DKA was in 08/19/2020.  Lab Tests/Imaging studies: I personally interpreted labs/imaging and the pertinent results include:    POC glucose 420  CBC notes a leukocytosis of 22.5. CMP notes a AKI, glucose of 434 and anion gap of 21 Urine does not ketones Beta-hydroxybutyrate acid elevated at 5.94 UDS positive for [facility]  Cardiac monitoring: EKG obtained and interpreted by myself and attending physician which shows: Sinus tachycardia  EKG Interpretation Date/Time:  Tuesday May 19 2024 22:44:19 EST Ventricular Rate:  144 PR Interval:  124 QRS Duration:  76 QT Interval:  278 QTC Calculation: 430 R Axis:   72  Text Interpretation: Sinus tachycardia Otherwise normal ECG When compared with ECG of 16-Jan-2024 20:52, No significant change was found Confirmed by Raford Lenis ([id number]) on 05/19/2024 11:27:22 PM          Medications: I ordered medication including Zofran , insulin , potassium, LR.  I have reviewed the patients home medicines and have made adjustments as needed.  Critical Interventions: Insulin   Social Determinants of Health:  Disposition: After consideration of the diagnostic results and the  patients response to treatment, I feel that the patient would benefit from admission, with Dr. Charlton accepting  patient care at this time.   Final diagnoses:  Diabetic ketoacidosis without coma associated with type 2 diabetes mellitus (HCC)  AKI (acute kidney injury)  Leukocytosis, unspecified type    ED Discharge Orders     None      Portions of this report may have been transcribed using voice recognition software. Every effort was made to ensure accuracy; however, inadvertent computerized transcription errors may be present.     Beola Terrall RAMAN, [place] 05/20/24 0304    Raford Lenis, MD 05/20/24 [phone]

## 2024-05-19 NOTE — ED Provider Notes (Incomplete)
 [place] EMERGENCY DEPARTMENT AT [facility] Provider Note   CSN: [id number] Arrival date & time: 05/19/24  2230     Patient presents with: Emesis   Erik Harper is a 28 y.o. male.  Emesis Patient is a 29 year old male presenting ED today for concerns for nausea, vomiting x 2 days with previous medical history of type 2 diabetes.  Last admission for DKA was in 2022.  Reports that he has not been taking any of his medications for the last few days but was compliant until he was sick with nausea and vomiting.  Reporting approximately 7-10 vomiting episodes per day, nonbilious, nonbloody.  Able to tolerate secretions but vomits every time he attempts to hydrate.  Has not eaten food x 2 days.  Reports abdominal cramping without abdominal pain.  Denies alcohol use, endorses THC use.  Denies fever, headache, vision changes, chest pain, hematemesis, cough, congestion, diarrhea, melena, hematochezia, dysuria, discharge, lower leg swelling, rashes.     Prior to Admission medications   Medication Sig Start Date End Date Taking? Authorizing Provider  amLODipine  (NORVASC ) 5 MG tablet Take 1 tablet (5 mg total) by mouth daily. 01/16/24   Emil Share, DO  blood glucose meter kit and supplies Dispense based on patient and insurance preference. Use up to four times daily as directed. (FOR ICD-10 E10.9, E11.9). 08/21/20   Cheryle Page, MD  insulin  NPH-regular Human (NOVOLIN  70/30 RELION) (70-30) 100 UNIT/ML injection Inject 30 Units into the skin 2 (two) times daily with a meal. Patient not taking: Reported on 01/16/2024 08/21/20   Cheryle Page, MD  Insulin  Syringe-Needle U-100 (INSULIN  SYRINGE .5CC/31GX5/16) 31G X 5/16 0.5 ML MISC Novolog  70/30 30units BID 08/21/20   Cheryle Page, MD  metFORMIN  (GLUCOPHAGE -XR) 750 MG 24 hr tablet Take 1 tablet (750 mg total) by mouth daily with breakfast. 01/16/24   Emil Share, DO    Allergies: Patient has no known allergies.    Review of Systems   Gastrointestinal:  Positive for nausea and vomiting.  All other systems reviewed and are negative.   Updated Vital Signs BP (!) 155/99 (BP Location: Right Arm)   Pulse (!) 145   Temp 99.2 F (37.3 C)   Resp 18   Ht 6' (1.829 m)   Wt (!) 149.7 kg   SpO2 98%   BMI 44.76 kg/m   Physical Exam Vitals and nursing note reviewed.  Constitutional:      General: He is not in acute distress.    Appearance: Normal appearance. He is not ill-appearing or diaphoretic.  HENT:     Head: Normocephalic and atraumatic.  Eyes:     General: No scleral icterus.       Right eye: No discharge.        Left eye: No discharge.     Extraocular Movements: Extraocular movements intact.     Conjunctiva/sclera: Conjunctivae normal.  Cardiovascular:     Rate and Rhythm: Regular rhythm. Tachycardia present.     Pulses: Normal pulses.     Heart sounds: Normal heart sounds. No murmur heard.    No friction rub. No gallop.  Pulmonary:     Effort: Pulmonary effort is normal. No respiratory distress.     Breath sounds: No stridor. No wheezing, rhonchi or rales.  Chest:     Chest wall: No tenderness.  Abdominal:     General: Abdomen is flat. There is no distension.     Palpations: Abdomen is soft.     Tenderness: There is  no abdominal tenderness. There is no right CVA tenderness, left CVA tenderness, guarding or rebound.  Musculoskeletal:        General: No swelling, deformity or signs of injury.     Cervical back: Normal range of motion. No rigidity.     Right lower leg: No edema.     Left lower leg: No edema.  Skin:    General: Skin is warm and dry.     Findings: No bruising, erythema or lesion.  Neurological:     General: No focal deficit present.     Mental Status: He is alert and oriented to person, place, and time. Mental status is at baseline.     Sensory: No sensory deficit.     Motor: No weakness.  Psychiatric:        Mood and Affect: Mood normal.     (all labs ordered are listed, but  only abnormal results are displayed) Labs Reviewed  CBG MONITORING, ED - Abnormal; Notable for the following components:      Result Value   Glucose-Capillary 420 (*)    All other components within normal limits  LIPASE, BLOOD  COMPREHENSIVE METABOLIC PANEL WITH GFR  CBC  URINALYSIS, ROUTINE W REFLEX MICROSCOPIC    EKG: None  Radiology: No results found. Procedures   Medications Ordered in the ED - No data to display    {Click here for ABCD2, HEART and other calculators REFRESH Note before signing:1}                              Medical Decision Making Amount and/or Complexity of Data Reviewed Labs: ordered.   This patient is a ***  who presents to the ED for concern of ***.   Differential diagnoses prior to evaluation: The emergent differential diagnosis includes, but is not limited to, DKA, hyperglycemia, gastritis, dehydration,. This is not an exhaustive differential.   Past Medical History / Co-morbidities / Social History: 30 diabetes  Additional history: Chart reviewed. Pertinent results include:   Last seen in the emergency department 01/16/2024 for chest pain.  Noted to have arrived tachycardic with heart rate in 140s, with mild metabolic acidosis and bicarb 19.  Negative workup at that time, provided amlodipine  and metformin .  Last admission for DKA was in 08/19/2020.  Lab Tests/Imaging studies: I personally interpreted labs/imaging and the pertinent results include:    POC glucose 420  CBC notes a leukocytosis of 22.5.   ***I agree with the radiologist interpretation.  Cardiac monitoring: EKG obtained and interpreted by myself and attending physician which shows: ***   Medications: I ordered medication including ***.  I have reviewed the patients home medicines and have made adjustments as needed.  Critical Interventions:  Social Determinants of Health:  Disposition: After consideration of the diagnostic results and the patients response to  treatment, I feel that the patient would benefit from ***.   ***emergency department workup does not suggest an emergent condition requiring admission or immediate intervention beyond what has been performed at this time. The plan is: ***. The patient is safe for discharge and has been instructed to return immediately for worsening symptoms, change in symptoms or any other concerns.   Final diagnoses:  None    ED Discharge Orders     None

## 2024-05-19 NOTE — ED Triage Notes (Signed)
Pt c/o NV x 2 days.

## 2024-05-20 ENCOUNTER — Encounter (HOSPITAL_COMMUNITY): Payer: Self-pay | Admitting: Family Medicine

## 2024-05-20 DIAGNOSIS — E1110 Type 2 diabetes mellitus with ketoacidosis without coma: Secondary | ICD-10-CM | POA: Diagnosis not present

## 2024-05-20 DIAGNOSIS — N179 Acute kidney failure, unspecified: Secondary | ICD-10-CM | POA: Diagnosis present

## 2024-05-20 DIAGNOSIS — Z91199 Patient's noncompliance with other medical treatment and regimen due to unspecified reason: Secondary | ICD-10-CM | POA: Diagnosis not present

## 2024-05-20 DIAGNOSIS — Z1152 Encounter for screening for COVID-19: Secondary | ICD-10-CM | POA: Diagnosis not present

## 2024-05-20 DIAGNOSIS — R6510 Systemic inflammatory response syndrome (SIRS) of non-infectious origin without acute organ dysfunction: Secondary | ICD-10-CM | POA: Diagnosis present

## 2024-05-20 DIAGNOSIS — R112 Nausea with vomiting, unspecified: Secondary | ICD-10-CM

## 2024-05-20 DIAGNOSIS — Z6841 Body Mass Index (BMI) 40.0 and over, adult: Secondary | ICD-10-CM | POA: Diagnosis not present

## 2024-05-20 DIAGNOSIS — Z833 Family history of diabetes mellitus: Secondary | ICD-10-CM | POA: Diagnosis not present

## 2024-05-20 DIAGNOSIS — I10 Essential (primary) hypertension: Secondary | ICD-10-CM | POA: Diagnosis present

## 2024-05-20 DIAGNOSIS — R6511 Systemic inflammatory response syndrome (SIRS) of non-infectious origin with acute organ dysfunction: Secondary | ICD-10-CM | POA: Diagnosis present

## 2024-05-20 DIAGNOSIS — Z79899 Other long term (current) drug therapy: Secondary | ICD-10-CM | POA: Diagnosis not present

## 2024-05-20 DIAGNOSIS — E66813 Obesity, class 3: Secondary | ICD-10-CM | POA: Diagnosis present

## 2024-05-20 DIAGNOSIS — Z794 Long term (current) use of insulin: Secondary | ICD-10-CM | POA: Diagnosis not present

## 2024-05-20 DIAGNOSIS — F1290 Cannabis use, unspecified, uncomplicated: Secondary | ICD-10-CM | POA: Diagnosis present

## 2024-05-20 DIAGNOSIS — Z7984 Long term (current) use of oral hypoglycemic drugs: Secondary | ICD-10-CM | POA: Diagnosis not present

## 2024-05-20 DIAGNOSIS — E876 Hypokalemia: Secondary | ICD-10-CM | POA: Diagnosis not present

## 2024-05-20 LAB — CBG MONITORING, ED
Glucose-Capillary: 187 mg/dL — ABNORMAL HIGH (ref 70–99)
Glucose-Capillary: 187 mg/dL — ABNORMAL HIGH (ref 70–99)
Glucose-Capillary: 196 mg/dL — ABNORMAL HIGH (ref 70–99)
Glucose-Capillary: 200 mg/dL — ABNORMAL HIGH (ref 70–99)
Glucose-Capillary: 219 mg/dL — ABNORMAL HIGH (ref 70–99)
Glucose-Capillary: 220 mg/dL — ABNORMAL HIGH (ref 70–99)
Glucose-Capillary: 223 mg/dL — ABNORMAL HIGH (ref 70–99)
Glucose-Capillary: 224 mg/dL — ABNORMAL HIGH (ref 70–99)
Glucose-Capillary: 227 mg/dL — ABNORMAL HIGH (ref 70–99)
Glucose-Capillary: 229 mg/dL — ABNORMAL HIGH (ref 70–99)
Glucose-Capillary: 238 mg/dL — ABNORMAL HIGH (ref 70–99)
Glucose-Capillary: 278 mg/dL — ABNORMAL HIGH (ref 70–99)
Glucose-Capillary: 280 mg/dL — ABNORMAL HIGH (ref 70–99)
Glucose-Capillary: 316 mg/dL — ABNORMAL HIGH (ref 70–99)
Glucose-Capillary: 413 mg/dL — ABNORMAL HIGH (ref 70–99)

## 2024-05-20 LAB — BETA-HYDROXYBUTYRIC ACID
Beta-Hydroxybutyric Acid: 0.47 mmol/L — ABNORMAL HIGH (ref 0.05–0.27)
Beta-Hydroxybutyric Acid: 1.03 mmol/L — ABNORMAL HIGH (ref 0.05–0.27)
Beta-Hydroxybutyric Acid: 5.94 mmol/L — ABNORMAL HIGH (ref 0.05–0.27)

## 2024-05-20 LAB — CBC
HCT: 44.9 % (ref 39.0–52.0)
Hemoglobin: 15.3 g/dL (ref 13.0–17.0)
MCH: 28.9 pg (ref 26.0–34.0)
MCHC: 34.1 g/dL (ref 30.0–36.0)
MCV: 84.7 fL (ref 80.0–100.0)
Platelets: 289 K/uL (ref 150–400)
RBC: 5.3 MIL/uL (ref 4.22–5.81)
RDW: 11.9 % (ref 11.5–15.5)
WBC: 22 K/uL — ABNORMAL HIGH (ref 4.0–10.5)
nRBC: 0 % (ref 0.0–0.2)

## 2024-05-20 LAB — BASIC METABOLIC PANEL WITH GFR
Anion gap: 12 (ref 5–15)
Anion gap: 14 (ref 5–15)
Anion gap: 9 (ref 5–15)
BUN: 11 mg/dL (ref 6–20)
BUN: 10 mg/dL (ref 6–20)
BUN: 8 mg/dL (ref 6–20)
CO2: 21 mmol/L — ABNORMAL LOW (ref 22–32)
CO2: 21 mmol/L — ABNORMAL LOW (ref 22–32)
CO2: 23 mmol/L (ref 22–32)
Calcium: 9.7 mg/dL (ref 8.9–10.3)
Calcium: 9.7 mg/dL (ref 8.9–10.3)
Calcium: 9.3 mg/dL (ref 8.9–10.3)
Chloride: 103 mmol/L (ref 98–111)
Chloride: 102 mmol/L (ref 98–111)
Chloride: 104 mmol/L (ref 98–111)
Creatinine, Ser: 1.2 mg/dL (ref 0.61–1.24)
Creatinine, Ser: 1.02 mg/dL (ref 0.61–1.24)
Creatinine, Ser: 0.73 mg/dL (ref 0.61–1.24)
GFR, Estimated: >60 mL/min (ref >60)
GFR, Estimated: >60 mL/min (ref >60)
GFR, Estimated: >60 mL/min (ref >60)
Glucose, Bld: 215 mg/dL — ABNORMAL HIGH (ref 70–99)
Glucose, Bld: 196 mg/dL — ABNORMAL HIGH (ref 70–99)
Glucose, Bld: 211 mg/dL — ABNORMAL HIGH (ref 70–99)
Potassium: 4.1 mmol/L (ref 3.5–5.1)
Potassium: 3.8 mmol/L (ref 3.5–5.1)
Potassium: 3.3 mmol/L — ABNORMAL LOW (ref 3.5–5.1)
Sodium: 136 mmol/L (ref 135–145)
Sodium: 137 mmol/L (ref 135–145)
Sodium: 136 mmol/L (ref 135–145)

## 2024-05-20 LAB — RESP PANEL BY RT-PCR (RSV, FLU A&B, COVID)  RVPGX2
Influenza A by PCR: NEGATIVE
Influenza B by PCR: NEGATIVE
Resp Syncytial Virus by PCR: NEGATIVE
SARS Coronavirus 2 by RT PCR: NEGATIVE

## 2024-05-20 LAB — RAPID URINE DRUG SCREEN, HOSP PERFORMED
Amphetamines: NOT DETECTED
Barbiturates: NOT DETECTED
Benzodiazepines: NOT DETECTED
Cocaine: NOT DETECTED
Opiates: NOT DETECTED
Tetrahydrocannabinol: POSITIVE — AB

## 2024-05-20 LAB — GLUCOSE, CAPILLARY
Glucose-Capillary: 248 mg/dL — ABNORMAL HIGH (ref 70–99)
Glucose-Capillary: 205 mg/dL — ABNORMAL HIGH (ref 70–99)
Glucose-Capillary: 199 mg/dL — ABNORMAL HIGH (ref 70–99)
Glucose-Capillary: 234 mg/dL — ABNORMAL HIGH (ref 70–99)
Glucose-Capillary: 210 mg/dL — ABNORMAL HIGH (ref 70–99)

## 2024-05-20 LAB — URINALYSIS, ROUTINE W REFLEX MICROSCOPIC
Bacteria, UA: NONE SEEN
Bilirubin Urine: NEGATIVE
Glucose, UA: >=500 mg/dL — AB
Ketones, ur: 80 mg/dL — AB
Leukocytes,Ua: NEGATIVE
Nitrite: NEGATIVE
Protein, ur: 100 mg/dL — AB
Specific Gravity, Urine: 1.028 (ref 1.005–1.030)
pH: 5 (ref 5.0–8.0)

## 2024-05-20 LAB — HIV ANTIBODY (ROUTINE TESTING W REFLEX): HIV Screen 4th Generation wRfx: NONREACTIVE

## 2024-05-20 LAB — MAGNESIUM: Magnesium: 2.1 mg/dL (ref 1.7–2.4)

## 2024-05-20 LAB — HEMOGLOBIN A1C
Hgb A1c MFr Bld: 9.7 % — ABNORMAL HIGH (ref 4.8–5.6)
Mean Plasma Glucose: 231.69 mg/dL

## 2024-05-20 MED ORDER — ENOXAPARIN SODIUM 80 MG/0.8ML IJ SOSY
75.0000 mg | PREFILLED_SYRINGE | INTRAMUSCULAR | Status: DC
  Administered 2024-05-20 – 2024-05-22 (×3): 75 mg via SUBCUTANEOUS
  Filled 2024-05-20 (×2): qty 0.8
  Filled 2024-05-20: qty 0.75

## 2024-05-20 MED ORDER — POTASSIUM CHLORIDE CRYS ER 20 MEQ PO TBCR
40.0000 meq | EXTENDED_RELEASE_TABLET | ORAL | Status: AC
  Administered 2024-05-20 – 2024-05-21 (×2): 40 meq via ORAL
  Filled 2024-05-20 (×2): qty 2

## 2024-05-20 MED ORDER — ACETAMINOPHEN 325 MG PO TABS: 650.0000 mg | ORAL_TABLET | Freq: Four times a day (QID) | ORAL | Status: DC | PRN

## 2024-05-20 MED ORDER — ONDANSETRON HCL 4 MG/2ML IJ SOLN: 4.0000 mg | Freq: Four times a day (QID) | INTRAMUSCULAR | Status: DC | PRN

## 2024-05-20 NOTE — ED Notes (Signed)
 Phleb at bedside

## 2024-05-20 NOTE — ED Notes (Addendum)
 Unable to get labs ?

## 2024-05-20 NOTE — Progress Notes (Signed)
 Erik Harper is a 28 y.o. male with medical history significant for type 2 diabetes mellitus and BMI 45 and noncompliance who presented with nausea and vomiting and was diagnosed with DKA and eventually admitted under hospital service after midnight.  Started on insulin  drip, blood sugar improved under 250, transition to dextrose  drip along with insulin  drip.  Patient seen and examined, still in the ED.  He says that he is feeling a lot better.  Complaining of some sore throat but no more nausea or vomiting.  Denies any fever, chills, sweating, cough or shortness of breath. On examination, lungs clear to auscultation, throat also clear without any exudate or erythema.  Patient's anion gap has improved but beta-hydroxybutyrate is still elevated, blood sugar has improved under 200 but he is not completely out of the DKA yet.  Will continue dextrose  along with insulin  drip and continue to monitor BMP every 4 hours along with beta-hydroxybutyrate and plan to transition to long-acting insulin  along with SSI once DKA has resolved.  Diabetes coordinator has been consulted.  Patient's presentation likely secondary to noncompliance as he stopped using insulin  more than a year ago and stopped checking his blood sugar about a month ago.  Total time spent on the encounter 39 minutes.

## 2024-05-20 NOTE — Progress Notes (Signed)
 TOC/ICM consulted for PCP need. List of local clinics accepting new patients added to AVS.

## 2024-05-20 NOTE — Inpatient Diabetes Management (Addendum)
 Inpatient Diabetes Program Recommendations  AACE/ADA: New Consensus Statement on Inpatient Glycemic Control (2015)  Target Ranges:  Prepandial:   less than 140 mg/dL      Peak postprandial:   less than 180 mg/dL (1-2 hours)      Critically ill patients:  140 - 180 mg/dL   Lab Results  Component Value Date   GLUCAP 187 (H) 05/20/2024   HGBA1C 9.7 (H) 05/20/2024    Latest Reference Range & Units 05/19/24 22:52  Sodium 135 - 145 mmol/L 134 (L)  Potassium 3.5 - 5.1 mmol/L 4.6  Chloride 98 - 111 mmol/L 97 (L)  CO2 22 - 32 mmol/L 16 (L)  Glucose 70 - 99 mg/dL 564 (H)  BUN 6 - 20 mg/dL 13  Creatinine 9.38 - 8.75 mg/dL 8.58 (H)  Calcium 8.9 - 10.3 mg/dL 89.9  Anion gap 5 - 15  21 (H)  (L): Data is abnormally low (H): Data is abnormally high  Latest Reference Range & Units 05/20/24 08:42  Sodium 135 - 145 mmol/L 137  Potassium 3.5 - 5.1 mmol/L 3.8  Chloride 98 - 111 mmol/L 102  CO2 22 - 32 mmol/L 21 (L)  Glucose 70 - 99 mg/dL 803 (H)  BUN 6 - 20 mg/dL 10  Creatinine 9.38 - 8.75 mg/dL 8.97  Calcium 8.9 - 89.6 mg/dL 9.7  Anion gap 5 - 15  14  (L): Data is abnormally low (H): Data is abnormally high  Latest Reference Range & Units 05/19/24 23:45 05/20/24 08:42  Beta-Hydroxybutyric Acid 0.05 - 0.27 mmol/L 5.94 (H) 1.03 (H)  (H): Data is abnormally high  Diabetes history: DM Outpatient Diabetes medications: Metformin  (not taking) Not taking insulin  Current orders for Inpatient glycemic control: IV insulin    Inpatient Diabetes Program Recommendations:    Note from [facility] from 01/16/24 has medications listed as Basaglar  40 units daily, Novolog  4-12 units tid meal coverage, Jardiance 10 mg daily, Ozempic 1 mg weekly Patient was started on insulin  on prior admission 08/20/20 after diagnosed with diabetes. Patient has also seen Dr. Kassie (endocrinologist in the past).  Spoke with patient regarding diabetes management. Patient states he did not see an  endocrinologist in 2025 and has not taken insulin  for approximately 2 years. Reviewed his current A1c of 9.7 (average blood glucose 232 over the past 2-3 months) and reviewed normal ranges with patient. Patient does not have a current PCP and discussed need to followup and have PCP.  12:25 pm Met with patient @ bedside to review insulin  administration. Patient states he does not need any further instruction other than nutrition. States he remembers how to administer Ozempic and insulin . Will order insulin  pen starter kit when admitted to unit. Reviewed basic nutrition with patient. Patient states 90% of the time he drinks water and eats whatever.  Please consider when ready to transition from IV insulin : Semglee /Lantus  30 units 2 hrs. Prior to D/C of insulin  drip. Novolog  4 units tid meal coverage tid if eats 50% meal Novolog  0-9 units q 4 hrs. Correction (cover CBG when IV insulin  discontinued)  Thank you, Kaleiyah Polsky E. Alichia Alridge, RN, MSN, CNS, CDCES  Diabetes Coordinator Inpatient Glycemic Control Team Team Pager [phone] (8am-5pm) 05/20/2024 10:16 AM

## 2024-05-20 NOTE — H&P (Signed)
 History and Physical    Erik Harper FMW:[id number] DOB: 1996/06/08 DOA: 05/19/2024  PCP: Patient, No Pcp Per   Patient coming from: Home   Chief Complaint: N/V   HPI: Erik Harper is a 28 y.o. male with medical history significant for type 2 diabetes mellitus and BMI 45 who presents with nausea and vomiting.  Patient had previously been managed with insulin  but has not used that in over a year.  He reports that he had been using metformin  up until a few days ago.  He has not checked his sugars at home recently, explaining that they had been well-controlled for a long time so he stopped checking.  3 days ago, he developed nausea with nonbloody vomiting.  Symptoms have been worsening and he is unable to tolerate anything by mouth.  He denies abdominal pain, diarrhea, fevers, or urinary symptoms.  He suspects that his sugar became uncontrolled due to recent dietary indiscretion.  ED Course: Upon arrival to the ED, patient is found to be afebrile and saturating well on room air with elevated heart rate and stable BP.  Labs are most notable for glucose 135, serum bicarbonate 16, anion gap 21, total protein 9.1, WBC 23,500, hemoglobin 16.3, beta-hydroxybutyrate 5.94, and ketonuria.    He was given 3 L LR, Zofran , 20 mEq IV potassium, and was started on insulin  infusion in the ED.  Review of Systems:  All other systems reviewed and apart from HPI, are negative.  Past Medical History:  Diagnosis Date   DKA (diabetic ketoacidoses) 05/2018   NEW DIABETES TYPE 2   Nausea & vomiting 05/21/2018    Past Surgical History:  Procedure Laterality Date   TONSILLECTOMY      Social History:   reports that he has never smoked. He has never used smokeless tobacco. He reports current alcohol use. He reports that he does not use drugs.  No Known Allergies  Family History  Problem Relation Age of Onset   Diabetes Mellitus II Mother      Prior to Admission medications   Medication Sig Start  Date End Date Taking? Authorizing Provider  amLODipine  (NORVASC ) 5 MG tablet Take 1 tablet (5 mg total) by mouth daily. 01/16/24  Yes Emil Share, DO  metFORMIN  (GLUCOPHAGE -XR) 750 MG 24 hr tablet Take 1 tablet (750 mg total) by mouth daily with breakfast. 01/16/24  Yes Emil Share, DO  blood glucose meter kit and supplies Dispense based on patient and insurance preference. Use up to four times daily as directed. (FOR ICD-10 E10.9, E11.9). 08/21/20   Cheryle Page, MD  insulin  NPH-regular Human (NOVOLIN  70/30 RELION) (70-30) 100 UNIT/ML injection Inject 30 Units into the skin 2 (two) times daily with a meal. Patient not taking: Reported on 01/16/2024 08/21/20   Cheryle Page, MD  Insulin  Syringe-Needle U-100 (INSULIN  SYRINGE .5CC/31GX5/16) 31G X 5/16 0.5 ML MISC Novolog  70/30 30units BID 08/21/20   Cheryle Page, MD    Physical Exam: Vitals:   05/19/24 2235 05/19/24 2242 05/19/24 2345 05/20/24 0030  BP: (!) 155/99  107/75 136/75  Pulse: (!) 145  (!) 123 (!) 112  Resp: 18  18 18   Temp: 99.2 F (37.3 C)     SpO2: 98%  100% 100%  Weight:  (!) 149.7 kg    Height:  6' (1.829 m)      Constitutional: NAD, calm  Eyes: PERTLA, lids and conjunctivae normal ENMT: Mucous membranes are moist. Posterior pharynx clear of any exudate or lesions.   Neck: supple, no  masses  Respiratory: no wheezing, no crackles. No accessory muscle use.  Cardiovascular: S1 & S2 heard, regular rate and rhythm. No extremity edema.  Abdomen: No tenderness, soft. Bowel sounds active.  Musculoskeletal: no clubbing / cyanosis. No joint deformity upper and lower extremities.   Skin: no significant rashes, lesions, ulcers. Warm, dry, well-perfused. Neurologic: CN 2-12 grossly intact. Moving all extremities. Alert and oriented.  Psychiatric: Pleasant. Cooperative.    Labs and Imaging on Admission: I have personally reviewed following labs and imaging studies  CBC: Recent Labs  Lab 05/19/24 2252 05/19/24 2347  WBC 23.5*  --    NEUTROABS 18.3*  --   HGB 16.3 16.7  17.0  HCT 47.8 49.0  50.0  MCV 85.1  --   PLT 331  --    Basic Metabolic Panel: Recent Labs  Lab 05/19/24 2252 05/19/24 2347  NA 134* 134*  136  K 4.6 4.3  4.3  CL 97* 102  CO2 16*  --   GLUCOSE 435* 440*  BUN 13 17  CREATININE 1.41* 1.20  CALCIUM 10.0  --    GFR: Estimated Creatinine Clearance: 139.2 mL/min (by C-G formula based on SCr of 1.2 mg/dL). Liver Function Tests: Recent Labs  Lab 05/19/24 2252  AST 32  ALT 42  ALKPHOS 61  BILITOT 2.6*  PROT 9.1*  ALBUMIN 4.7   Recent Labs  Lab 05/19/24 2252  LIPASE 42   No results for input(s): AMMONIA in the last 168 hours. Coagulation Profile: No results for input(s): INR, PROTIME in the last 168 hours. Cardiac Enzymes: No results for input(s): CKTOTAL, CKMB, CKMBINDEX, TROPONINI in the last 168 hours. BNP (last 3 results) No results for input(s): PROBNP in the last 8760 hours. HbA1C: No results for input(s): HGBA1C in the last 72 hours. CBG: Recent Labs  Lab 05/19/24 2247 05/20/24 0012 05/20/24 0116 05/20/24 0210  GLUCAP 420* 413* 316* 278*   Lipid Profile: No results for input(s): CHOL, HDL, LDLCALC, TRIG, CHOLHDL, LDLDIRECT in the last 72 hours. Thyroid Function Tests: No results for input(s): TSH, T4TOTAL, FREET4, T3FREE, THYROIDAB in the last 72 hours. Anemia Panel: No results for input(s): VITAMINB12, FOLATE, FERRITIN, TIBC, IRON, RETICCTPCT in the last 72 hours. Urine analysis:    Component Value Date/Time   COLORURINE YELLOW 05/20/2024 0050   APPEARANCEUR CLEAR 05/20/2024 0050   LABSPEC 1.028 05/20/2024 0050   PHURINE 5.0 05/20/2024 0050   GLUCOSEU >=500 (A) 05/20/2024 0050   HGBUR MODERATE (A) 05/20/2024 0050   BILIRUBINUR NEGATIVE 05/20/2024 0050   KETONESUR 80 (A) 05/20/2024 0050   PROTEINUR 100 (A) 05/20/2024 0050   NITRITE NEGATIVE 05/20/2024 0050   LEUKOCYTESUR NEGATIVE 05/20/2024 0050    Sepsis Labs: @LABRCNTIP (procalcitonin:4,lacticidven:4) ) Recent Results (from the past 240 hours)  Resp panel by RT-PCR (RSV, Flu A&B, Covid) Anterior Nasal Swab     Status: None   Collection Time: 05/19/24 11:09 PM   Specimen: Anterior Nasal Swab  Result Value Ref Range Status   SARS Coronavirus 2 by RT PCR NEGATIVE NEGATIVE Final   Influenza A by PCR NEGATIVE NEGATIVE Final   Influenza B by PCR NEGATIVE NEGATIVE Final    Comment: (NOTE) The Xpert Xpress SARS-CoV-2/FLU/RSV plus assay is intended as an aid in the diagnosis of influenza from Nasopharyngeal swab specimens and should not be used as a sole basis for treatment. Nasal washings and aspirates are unacceptable for Xpert Xpress SARS-CoV-2/FLU/RSV testing.  Fact Sheet for Patients: bloggercourse.com  Fact Sheet for Healthcare Providers: seriousbroker.it  This  test is not yet approved or cleared by the [place]  FDA and has been authorized for detection and/or diagnosis of SARS-CoV-2 by FDA under an Emergency Use Authorization (EUA). This EUA will remain in effect (meaning this test can be used) for the duration of the COVID-19 declaration under Section 564(b)(1) of the Act, 21 U.S.C. section 360bbb-3(b)(1), unless the authorization is terminated or revoked.     Resp Syncytial Virus by PCR NEGATIVE NEGATIVE Final    Comment: (NOTE) Fact Sheet for Patients: bloggercourse.com  Fact Sheet for Healthcare Providers: seriousbroker.it  This test is not yet approved or cleared by the [place]  FDA and has been authorized for detection and/or diagnosis of SARS-CoV-2 by FDA under an Emergency Use Authorization (EUA). This EUA will remain in effect (meaning this test can be used) for the duration of the COVID-19 declaration under Section 564(b)(1) of the Act, 21 U.S.C. section 360bbb-3(b)(1), unless the authorization  is terminated or revoked.  Performed at [facility], [address], [place], [place] [address]      Radiological Exams on Admission: No results found.  EKG: Independently reviewed. Sinus tachycardia, rate 144.   Assessment/Plan  1. DKA  - Patient suspects this is due to recent dietary indiscretion  - Continue insulin  infusion and IVF hydration with frequent CBGs and serial chem panels, consult diabetes educator    2. Nausea and vomiting  - No abdominal pain, fever, or diarrhea  - Likely secondary to DKA and is improving with treatment  - Treat DKA, advance diet as he improves   3. SIRS  - WBC is 23.5 and HR elevated without fever or apparent infectious process  - Likely reactive, will repeat CBC in am, monitor for infectious s/s    DVT prophylaxis: Lovenox   Code Status: Full  Level of Care: Level of care: Progressive Family Communication: None present  Disposition Plan:  Patient is from: Home  Anticipated d/c is to: Home  Anticipated d/c date is: 05/21/24  Patient currently: Pending resolution of DKA, tolerance of adequate oral intake  Consults called: None  Admission status: Observation     Evalene GORMAN Sprinkles, MD [facility]  05/20/2024, 3:27 AM

## 2024-05-20 NOTE — ED Notes (Signed)
 Phleb to obtain labs

## 2024-05-20 NOTE — ED Notes (Signed)
 Checked patient blood sugar it was 196

## 2024-05-20 NOTE — ED Notes (Signed)
 Per MD Pahwani collect beta hydroxy and 1105 when BMP is due

## 2024-05-20 NOTE — ED Notes (Addendum)
 MD Pahwani messaged regarding expired potassium from 0100, awaiting order from provider. MD also notified of failed peripheral start for lab draw by RN.

## 2024-05-20 NOTE — Progress Notes (Signed)
° ° °  EXPEDITER LEVEL LOADING ASSESSMENT NOTE  Patient Name: Erik Harper  DOB:Erik Harper 10, 1997 Date of Admission: 05/19/2024  Date of Assessment:05/20/24   -------------------------------------------------------------------------------------------------------------------   Brief clinical summary:  28 y.o. male with medical history significant for type 2 diabetes mellitus and BMI 45 who presents with nausea and vomiting.   Is there Bed Availability at another [facility]? Yes  If yes, what facility: Erik Harper  Level of Care Needed:  Yes  MD Agree to transfer:   Patient agrees to transfer: No    -------------------------------------------------------------------------------------------------------------------  [facility] RN Expediter, Ailanie Ruttan S Anetria Harwick Please contact us  directly via secure chat (search for [facility]) or by calling us  at [phone] [facility]).

## 2024-05-20 NOTE — Discharge Instructions (Addendum)
 Clnics Accepting New Patients  *You may have an assigned primary care provider. Check your Medicaid card for details.    [facility] [address], [place], [place] [address] Hours of Operation Monday and Thursday 9AM - 8PM Tuesday and Wednesday 9AM - 5PM Friday, Saturday and Sunday Closed Phone [phone] (English and Spanish) Email [email or web address]  Providing primary, dental, and vision care, mental health services, and medication assistance to those who can't afford it.   [employer] [employer] [address], [place] Monday - Wednesday 8:30 AM - 5 PM Thursday 8:30 AM - 8 PM $5 per visit - Call for an eligibility appointment [phone]  [facility] [address] [place], [address] Monday - Friday 8:30 AM - 5:30 PM Saturdays 9 AM - 1 PM [phone] Serves patients from birth to age 1, providing well visits & sick child care - children who are chronically ill, developmentally delayed or affected by mental health issues.   [facility]) [address] [place], [place] Monday - Friday 9 AM - 6 PM Walk-in Clinic Monday - Thursday 9 AM - 10:30 AM [phone] or [phone] *This clinic sees patients with or without insurance*  [facility] at [facility] [address] [place], [place] [address] [phone]  [facility] [address] Monday - Friday 9 AM - 1 PM 2nd & 4th Saturdays 9 AM - 1 PM Speak with Pam if you are self-pay (no insurance) [phone] or [phone]  Family Medicine at [facility]  Adult & Pediatric Medicine [address] [facility] Monday - Friday 8 AM - 5 PM On site pharmacy Monday - Friday 8 AM - 4:30 PM (closed for lunch 1-1:30 daily) As a [facility], they serve patients with or without insurance and connect  families to necessary resources. Ph: [phone] Fax: [phone]  [facility] [address] [place], [place]  [phone] [address] [place], [place]  [phone] [email or web address] Monday, Tuesday, Thursday, Friday 8 AM - 5 PM Saturday 8 AM - 1 PM Wednesday - closed Medicare, Medicaid, most insurance For the uninsured, payment plans start at $60 Staff speaks Spanish (la personal habla espaol)  [facility] [address], [place]  [phone] [address], [place]  [phone] Dr. Catalina, $120 for self-pay (no insurance)   To see if you qualify for government resources, please call: [employer] [place], [place] [address], [place] [phone]  _______________________________________________ Plate Method for Diabetes   Foods with carbohydrates make your blood glucose level go up. The plate method is a simple way to meal plan and control the amount of carbohydrate you eat.         Use the following guidance to build a healthy plate to control carbohydrates. Divide a 9-inch plate into 3 sections, and consider your beverage the 4th section of your meal: Food Group Examples of Foods/Beverages for This Section of your Meal  Section 1: Non-starchy vegetables Fill  of your plate to include non-starchy vegetables Asparagus, broccoli, brussels sprouts, cabbage, carrots, cauliflower, celery, cucumber, green beans, mushrooms, peppers, salad greens, tomatoes, or zucchini.  Section 2: Protein foods Fill  of your plate to include a lean protein Lean meat, poultry, fish, seafood, cheese, eggs, lean deli meat, tofu, beans, lentils, nuts or  nut butters.  Section 3: Carbohydrate foods Fill  of your plate to include carbohydrate foods Whole grains, whole wheat bread, brown rice, whole grain pasta, polenta, corn tortillas, fruit, or starchy vegetables (potatoes, green peas, corn, beans, acorn  squash, and butternut squash). One cup of milk also counts as a food that contains carbohydrate.  Section 4: Beverage Choose water or a low-calorie drink for your beverage. Unsweetened tea, coffee, or flavored/sparkling water without added sugar.  Image reprinted with permission from The American Diabetes Association.  Copyright 2022 by the American Diabetes Association.   Copyright 2022  Academy of Nutrition and Dietetics. All rights reserved

## 2024-05-20 NOTE — ED Notes (Signed)
 Unable to get labs ?

## 2024-05-20 NOTE — Progress Notes (Signed)
° °  Brief Progress Note   _____________________________________________________________________________________________________________  Patient Name: Larrie Fraizer Patient DOB: 1995/08/23 Date: @TODAY @      Data: Reviewed vital signs, labs, and notes.    Action: No action required at this time.    Response:    _____________________________________________________________________________________________________________  [facility] RN Expeditor Taren Dymek S Janith Nielson Please contact us  directly via secure chat (search for [facility]) or by calling us  at [phone] [facility]).

## 2024-05-21 ENCOUNTER — Other Ambulatory Visit (HOSPITAL_COMMUNITY): Payer: Self-pay

## 2024-05-21 ENCOUNTER — Telehealth (HOSPITAL_COMMUNITY): Payer: Self-pay

## 2024-05-21 LAB — GLUCOSE, CAPILLARY
Glucose-Capillary: 138 mg/dL — ABNORMAL HIGH (ref 70–99)
Glucose-Capillary: 147 mg/dL — ABNORMAL HIGH (ref 70–99)
Glucose-Capillary: 157 mg/dL — ABNORMAL HIGH (ref 70–99)
Glucose-Capillary: 190 mg/dL — ABNORMAL HIGH (ref 70–99)
Glucose-Capillary: 216 mg/dL — ABNORMAL HIGH (ref 70–99)
Glucose-Capillary: 227 mg/dL — ABNORMAL HIGH (ref 70–99)
Glucose-Capillary: 227 mg/dL — ABNORMAL HIGH (ref 70–99)
Glucose-Capillary: 270 mg/dL — ABNORMAL HIGH (ref 70–99)

## 2024-05-21 LAB — CBC
HCT: 42.2 % (ref 39.0–52.0)
Hemoglobin: 14.4 g/dL (ref 13.0–17.0)
MCH: 29 pg (ref 26.0–34.0)
MCHC: 34.1 g/dL (ref 30.0–36.0)
MCV: 85.1 fL (ref 80.0–100.0)
Platelets: 238 K/uL (ref 150–400)
RBC: 4.96 MIL/uL (ref 4.22–5.81)
RDW: 11.7 % (ref 11.5–15.5)
WBC: 12.8 K/uL — ABNORMAL HIGH (ref 4.0–10.5)
nRBC: 0 % (ref 0.0–0.2)

## 2024-05-21 LAB — BASIC METABOLIC PANEL WITH GFR
Anion gap: 10 (ref 5–15)
BUN: 6 mg/dL (ref 6–20)
CO2: 23 mmol/L (ref 22–32)
Calcium: 9.4 mg/dL (ref 8.9–10.3)
Chloride: 106 mmol/L (ref 98–111)
Creatinine, Ser: 0.86 mg/dL (ref 0.61–1.24)
GFR, Estimated: >60 mL/min (ref >60)
Glucose, Bld: 137 mg/dL — ABNORMAL HIGH (ref 70–99)
Potassium: 3.3 mmol/L — ABNORMAL LOW (ref 3.5–5.1)
Sodium: 139 mmol/L (ref 135–145)

## 2024-05-21 MED ORDER — INSULIN ASPART 100 UNIT/ML IJ SOLN
0.0000 [IU] | Freq: Three times a day (TID) | INTRAMUSCULAR | Status: DC
  Administered 2024-05-21: 5 [IU] via SUBCUTANEOUS
  Administered 2024-05-21: 8 [IU] via SUBCUTANEOUS
  Administered 2024-05-21 – 2024-05-22 (×2): 5 [IU] via SUBCUTANEOUS
  Filled 2024-05-21: qty 8
  Filled 2024-05-21 (×3): qty 5

## 2024-05-21 MED ORDER — LIVING WELL WITH DIABETES BOOK
Freq: Once | Status: AC
  Filled 2024-05-21: qty 1

## 2024-05-21 MED ORDER — INSULIN GLARGINE 100 UNIT/ML ~~LOC~~ SOLN
30.0000 [IU] | Freq: Every day | SUBCUTANEOUS | Status: DC
  Administered 2024-05-21 (×2): 30 [IU] via SUBCUTANEOUS
  Filled 2024-05-21 (×4): qty 0.3

## 2024-05-21 MED ORDER — INSULIN ASPART 100 UNIT/ML IJ SOLN
0.0000 [IU] | Freq: Every day | INTRAMUSCULAR | Status: DC
  Administered 2024-05-21: 2 [IU] via SUBCUTANEOUS
  Filled 2024-05-21: qty 2

## 2024-05-21 MED ORDER — POTASSIUM CHLORIDE CRYS ER 20 MEQ PO TBCR: 40.0000 meq | EXTENDED_RELEASE_TABLET | Freq: Four times a day (QID) | ORAL | Status: DC

## 2024-05-21 MED ORDER — POTASSIUM CHLORIDE CRYS ER 20 MEQ PO TBCR
40.0000 meq | EXTENDED_RELEASE_TABLET | Freq: Once | ORAL | Status: AC
  Administered 2024-05-21: 40 meq via ORAL
  Filled 2024-05-21: qty 2

## 2024-05-21 MED ORDER — LACTATED RINGERS IV SOLN: INTRAVENOUS | Status: AC

## 2024-05-21 MED ORDER — INSULIN STARTER KIT- PEN NEEDLES (ENGLISH)
1.0000 | Freq: Once | Status: AC
  Administered 2024-05-21: 1
  Filled 2024-05-21: qty 1

## 2024-05-21 MED ORDER — METFORMIN HCL 500 MG PO TABS
500.0000 mg | ORAL_TABLET | Freq: Two times a day (BID) | ORAL | Status: DC
  Administered 2024-05-21 (×2): 500 mg via ORAL
  Filled 2024-05-21 (×2): qty 1

## 2024-05-21 MED ORDER — DEXTROSE IN LACTATED RINGERS 5 % IV SOLN: INTRAVENOUS | Status: AC

## 2024-05-21 MED ADMIN — Losartan Potassium Tab 50 MG: 25 mg | ORAL | NDC 68180037709

## 2024-05-21 MED FILL — Losartan Potassium Tab 50 MG: 25.0000 mg | ORAL | Qty: 1 | Status: AC

## 2024-05-21 NOTE — Telephone Encounter (Signed)
 Pharmacy Patient Advocate Encounter  Received notification from [employer] that Prior Authorization for Freestyle Libre 3 Plus Sensor has been APPROVED from 05/21/24 to 11/17/24. Ran test claim, Copay is $0. This test claim was processed through [facility]- copay amounts may vary at other pharmacies due to pharmacy/plan contracts, or as the patient moves through the different stages of their insurance plan.   PA #/Case ID/Reference #: [id number]

## 2024-05-21 NOTE — Plan of Care (Signed)
°  Problem: Coping: Goal: Ability to adjust to condition or change in health will improve Outcome: Progressing   Problem: Metabolic: Goal: Ability to maintain appropriate glucose levels will improve Outcome: Progressing   Problem: Health Behavior/Discharge Planning: Goal: Ability to manage health-related needs will improve Outcome: Progressing   Problem: Education: Goal: Knowledge of General Education information will improve Description: Including pain rating scale, medication(s)/side effects and non-pharmacologic comfort measures Outcome: Progressing

## 2024-05-21 NOTE — TOC Initial Note (Signed)
 Transition of Care [facility]) - Initial/Assessment Note    Patient Details  Name: Erik Harper MRN: [id number] Date of Birth: 06-08-1996  [facility]) CM/SW Contact:    Landry DELENA Senters, RN Phone Number: 05/21/2024, 10:17 AM  Clinical Narrative:                 CC: medical history significant for type 2 diabetes mellitus and BMI 45 who presents with nausea and vomiting. Patient had previously been managed with insulin  but has not used that in over a year.  He reports that he had been using metformin  up until a few days ago.  He has not checked his sugars at home recently, explaining that they had been well-controlled for a long time so he stopped checking.  3 days ago, he developed nausea with nonbloody vomiting.  Symptoms have been worsening and he is unable to tolerate anything by mouth.  Patient lives alone, does report having support from family when needed. Patient drives, has no PCP, as of recently is not taking any routine medications, has no DME at home, is not currently working but is looking for a job.  CM to set up PCP appt, info will be added to AVS.   Patient did request nutrition consult for more in-depth information on diabetic diet needs and recommendations. CM to follow up with care team on this.   CM will continue to follow.   Expected Discharge Plan: Home/Self Care Barriers to Discharge: Continued Medical Work up   Patient Goals and CMS Choice            Expected Discharge Plan and Services       Living arrangements for the past 2 months: Apartment                                      Prior Living Arrangements/Services Living arrangements for the past 2 months: Apartment Lives with:: Self Patient language and need for interpreter reviewed:: Yes Do you feel safe going back to the place where you live?: Yes      Need for Family Participation in Patient Care: Yes (Comment) Care giver support system in place?: Yes (comment)   Criminal  Activity/Legal Involvement Pertinent to Current Situation/Hospitalization: No - Comment as needed  Activities of Daily Living      Permission Sought/Granted                  Emotional Assessment Appearance:: Developmentally appropriate Attitude/Demeanor/Rapport: Engaged Affect (typically observed): Calm Orientation: : Oriented to Self, Oriented to Place, Oriented to  Time, Oriented to Situation Alcohol / Substance Use: Not Applicable Psych Involvement: No (comment)  Admission diagnosis:  DKA (diabetic ketoacidosis) (HCC) [E11.10] AKI (acute kidney injury) [N17.9] Diabetic ketoacidosis without coma associated with type 2 diabetes mellitus (HCC) [E11.10] Leukocytosis, unspecified type [D72.829] Patient Active Problem List   Diagnosis Date Noted   SIRS (systemic inflammatory response syndrome) (HCC) 05/20/2024   Cannabis abuse 08/19/2020   DKA (diabetic ketoacidosis) (HCC) 05/21/2018   ARF (acute renal failure) 05/21/2018   Abdominal pain 05/21/2018   Nausea & vomiting 05/21/2018   PCP:  Patient, No Pcp Per Pharmacy:   [id number] [place], [place] - [address] AT [facility] [address] [place] [address] Phone: [phone] Fax: [phone]     Social Drivers of Health (SDOH) Social History:  SDOH Screenings   Tobacco Use: Low Risk (05/20/2024)   SDOH Interventions:     Readmission Risk Interventions     No data to display

## 2024-05-21 NOTE — Progress Notes (Signed)
 Nutrition Education Note   RD consulted for nutrition education regarding diabetes.   Lab Results  Component Value Date   HGBA1C 9.7 (H) 05/20/2024    RD provided What is Diabetes, Power of Protein, and Fiber and Diabetes handouts. Discussed different food groups and their effects on blood sugar, emphasizing carbohydrate-containing foods. Provided list of carbohydrates and recommended serving sizes of common foods.  Discussed importance of controlled and consistent carbohydrate intake throughout the day. Provided examples of ways to balance meals/snacks and encouraged intake of high-fiber, whole grain complex carbohydrates. Teach back method used.  Expect adequate compliance. Did ask if patient would be interested in outpatient education. He is amicable and referral made.   Body mass index is 44.76 kg/m. Pt meets criteria for morbid obesity based on current BMI. Notably, this designation may be skewed as he is with more muscle mass   Current diet order is carb modified, patient is consuming adequate amounts of meals at this time, per report. No documentation to review. Labs and medications reviewed. No further nutrition interventions warranted at this time. RD contact information provided. If additional nutrition issues arise, please re-consult RD.  Blair Deaner MS, RD, LDN Registered Dietitian Clinical Nutrition RD Inpatient Contact Info in Amion

## 2024-05-21 NOTE — Progress Notes (Signed)
 TRH night cross cover note:   Endo tool recommending reevaluation for transition to subcutaneous insulin .  Per brief chart review, regarding this patient admitted with DKA,  his most recent BMP reflects bicarbonate 23, up from prior 21, with closure of anion gap.  Additional beta adjustment uric acid level continues to trend down, now 0.47.  Most recent blood sugar 190.  I initiated the transition to sq insulin  order set.  As it does not appear that the patient is on a basal insulin  at home, 0.3 units/kg would equate to 44.7 units of daily basal insulin .  I subsequently ordered Lantus  30 units subcu daily, first dose now, and updated orders to reflect insulin  continuation of D5 LR as well as insulin  drip for 2 additional hours, followed by initiation of lactated Ringer 's at 125 cc/h at the 2-hour mark.  I communicated this plan to the patient's RN.   Eva Pore, DO Hospitalist

## 2024-05-21 NOTE — Progress Notes (Addendum)
 PROGRESS NOTE    Erik Harper  FMW:[id number] DOB: 03/13/1996 DOA: 05/19/2024 PCP: Patient, No Pcp Per   Chief Complaint  Patient presents with   Emesis    Brief Narrative:    Erik Harper is a 28 y.o. male with medical history significant for type 2 diabetes mellitus and BMI 45 who presents with nausea and vomiting.   Patient had previously been managed with insulin  but has not used that in over a year.  He reports that he had been using metformin  up until a few days ago.  He has not checked his sugars at home recently, explaining that they had been well-controlled for a long time so he stopped checking.  3 days ago, he developed nausea with nonbloody vomiting.  Symptoms have been worsening and he is unable to tolerate anything by mouth.  He denies abdominal pain, diarrhea, fevers, or urinary symptoms.  He suspects that his sugar became uncontrolled due to recent dietary indiscretion.   In  ED, patient is found to be afebrile and saturating well on room air with elevated heart rate and stable BP.  Labs are most notable for glucose 435, serum bicarbonate 16, anion gap 21, total protein 9.1, WBC 23,500, hemoglobin 16.3, beta-hydroxybutyrate 5.94, and ketonuria.     He was given 3 L LR, Zofran , 20 mEq IV potassium, and was started on insulin  infusion in the ED.  Assessment & Plan:   Principal Problem:   DKA (diabetic ketoacidosis) (HCC) Active Problems:   Nausea & vomiting   SIRS (systemic inflammatory response syndrome) (HCC)    Diabetes mellitus, type II, poorly controlled with DKA -DKA present on admission, elevated anion gap, low bicarb level, with hyperglycemia and elevated BHA and ketonuria -A1c 9.7 -On insulin  drip, anion gap closed, transition to subcu insulin , start Levemir  30 units, with insulin  sliding scale. -Will start on metformin . - Diabetic coordinator input appreciated - Will check lipid panel. - Started on low-dose losartan  Hypertension -Started on  low-dose [place] specially his diabetic   Nausea and vomiting  - No abdominal pain, fever, or diarrhea  - Likely secondary to DKA and is improving with treatment  - Treat DKA, advance diet as he improves  -Resolved   SIRS  - WBC is 23.5 and HR elevated without fever or apparent infectious process  - Likely reactive, improving  Obesity class III - Body mass index is 44.76 kg/m. Continue with metformin   Hypokalemia  - Replaced  THC use -  counseled   DVT prophylaxis: (Lovenox ) Code Status: (Full) Disposition: Home tomorrow once CBG controlled on current dose Levemir   Status is: Inpatient    Consultants:  none     Subjective:  No further nausea or vomiting, asking if he can advance his diet  Objective: Vitals:   05/20/24 2000 05/20/24 2344 05/21/24 0507 05/21/24 0833  BP: (!) 143/95 (!) 147/89 119/78 129/77  Pulse:  91 86 86  Resp:  12 17 19   Temp: 99.1 F (37.3 C) 97.7 F (36.5 C) 97.7 F (36.5 C) 98.6 F (37 C)  TempSrc: Axillary Oral Oral Oral  SpO2:      Weight:      Height:       No intake or output data in the 24 hours ending 05/21/24 1137 Filed Weights   05/19/24 2242  Weight: (!) 149.7 kg    Examination:  Awake Alert, Oriented X 3, No new F.N deficits, Normal affect  CTAB RRR +ve B.Sounds, Abd Soft, No tenderness, No rebound -  guarding or rigidity. No Cyanosis, Clubbing or edema, No new Rash or bruise       Data Reviewed: I have personally reviewed following labs and imaging studies  CBC: Recent Labs  Lab 05/19/24 2252 05/19/24 2347 05/20/24 0352 05/21/24 0302  WBC 23.5*  --  22.0* 12.8*  NEUTROABS 18.3*  --   --   --   HGB 16.3 16.7  17.0 15.3 14.4  HCT 47.8 49.0  50.0 44.9 42.2  MCV 85.1  --  84.7 85.1  PLT 331  --  289 238    Basic Metabolic Panel: Recent Labs  Lab 05/19/24 2252 05/19/24 2347 05/20/24 0352 05/20/24 0842 05/20/24 1947 05/21/24 0302  NA 134* 134*  136 136 137 136 139  K 4.6 4.3  4.3 4.1 3.8  3.3* 3.3*  CL 97* 102 103 102 104 106  CO2 16*  --  21* 21* 23 23  GLUCOSE 435* 440* 215* 196* 211* 137*  BUN 13 17 11 10 8 6   CREATININE 1.41* 1.20 1.20 1.02 0.73 0.86  CALCIUM 10.0  --  9.7 9.7 9.3 9.4  MG  --   --  2.1  --   --   --     GFR: Estimated Creatinine Clearance: 194.2 mL/min (by C-G formula based on SCr of 0.86 mg/dL).  Liver Function Tests: Recent Labs  Lab 05/19/24 2252  AST 32  ALT 42  ALKPHOS 61  BILITOT 2.6*  PROT 9.1*  ALBUMIN 4.7    CBG: Recent Labs  Lab 05/21/24 0011 05/21/24 0113 05/21/24 0154 05/21/24 0315 05/21/24 0832  GLUCAP 190* 147* 138* 157* 227*     Recent Results (from the past 240 hours)  Resp panel by RT-PCR (RSV, Flu A&B, Covid) Anterior Nasal Swab     Status: None   Collection Time: 05/19/24 11:09 PM   Specimen: Anterior Nasal Swab  Result Value Ref Range Status   SARS Coronavirus 2 by RT PCR NEGATIVE NEGATIVE Final   Influenza A by PCR NEGATIVE NEGATIVE Final   Influenza B by PCR NEGATIVE NEGATIVE Final    Comment: (NOTE) The Xpert Xpress SARS-CoV-2/FLU/RSV plus assay is intended as an aid in the diagnosis of influenza from Nasopharyngeal swab specimens and should not be used as a sole basis for treatment. Nasal washings and aspirates are unacceptable for Xpert Xpress SARS-CoV-2/FLU/RSV testing.  Fact Sheet for Patients: bloggercourse.com  Fact Sheet for Healthcare Providers: seriousbroker.it  This test is not yet approved or cleared by the [place]  FDA and has been authorized for detection and/or diagnosis of SARS-CoV-2 by FDA under an Emergency Use Authorization (EUA). This EUA will remain in effect (meaning this test can be used) for the duration of the COVID-19 declaration under Section 564(b)(1) of the Act, 21 U.S.C. section 360bbb-3(b)(1), unless the authorization is terminated or revoked.     Resp Syncytial Virus by PCR NEGATIVE NEGATIVE Final     Comment: (NOTE) Fact Sheet for Patients: bloggercourse.com  Fact Sheet for Healthcare Providers: seriousbroker.it  This test is not yet approved or cleared by the [place]  FDA and has been authorized for detection and/or diagnosis of SARS-CoV-2 by FDA under an Emergency Use Authorization (EUA). This EUA will remain in effect (meaning this test can be used) for the duration of the COVID-19 declaration under Section 564(b)(1) of the Act, 21 U.S.C. section 360bbb-3(b)(1), unless the authorization is terminated or revoked.  Performed at [facility], [address], [place], [place] [address]  Radiology Studies: No results found.      Scheduled Meds:  enoxaparin  (LOVENOX ) injection  75 mg Subcutaneous Q24H   insulin  aspart  0-15 Units Subcutaneous TID WC   insulin  aspart  0-5 Units Subcutaneous QHS   insulin  glargine  30 Units Subcutaneous QHS   insulin  starter kit- pen needles  1 kit Other Once   living well with diabetes book   Does not apply Once   losartan  25 mg Oral Daily   metFORMIN   500 mg Oral BID WC   Continuous Infusions:  lactated ringers  125 mL/hr at 05/21/24 0321     LOS: 1 day       Brayton Lye, MD [facility]   To contact the attending provider between 7A-7P or the covering provider during after hours 7P-7A, please log into the web site [email or web address] and access using universal [place] password for that web site. If you do not have the password, please call the hospital operator.  05/21/2024, 11:37 AM

## 2024-05-21 NOTE — Telephone Encounter (Signed)
 Pharmacy Patient Advocate Encounter  Insurance verification completed.    The patient is insured through [facility].     Ran test claim for Lantus  100unit Pen and the current 30 day co-pay is $4.  Ran test claim for Generic Novolog  100unit Pen and the current 30 day co-pay is $4.  This test claim was processed through [employer]- copay amounts may vary at other pharmacies due to [employer], or as the patient moves through the different stages of their insurance plan.

## 2024-05-22 ENCOUNTER — Other Ambulatory Visit (HOSPITAL_COMMUNITY): Payer: Self-pay

## 2024-05-22 LAB — LIPID PANEL
Cholesterol: 165 mg/dL (ref 0–200)
HDL: 19 mg/dL — ABNORMAL LOW (ref >40)
LDL Cholesterol: 66 mg/dL (ref 0–99)
Total CHOL/HDL Ratio: 8.7 ratio
Triglycerides: 400 mg/dL — ABNORMAL HIGH (ref <150)
VLDL: 80 mg/dL — ABNORMAL HIGH (ref 0–40)

## 2024-05-22 LAB — BASIC METABOLIC PANEL WITH GFR
Anion gap: 12 (ref 5–15)
BUN: 6 mg/dL (ref 6–20)
CO2: 21 mmol/L — ABNORMAL LOW (ref 22–32)
Calcium: 8.7 mg/dL — ABNORMAL LOW (ref 8.9–10.3)
Chloride: 101 mmol/L (ref 98–111)
Creatinine, Ser: 0.81 mg/dL (ref 0.61–1.24)
GFR, Estimated: >60 mL/min (ref >60)
Glucose, Bld: 208 mg/dL — ABNORMAL HIGH (ref 70–99)
Potassium: 3.4 mmol/L — ABNORMAL LOW (ref 3.5–5.1)
Sodium: 134 mmol/L — ABNORMAL LOW (ref 135–145)

## 2024-05-22 LAB — CBC
HCT: 38.5 % — ABNORMAL LOW (ref 39.0–52.0)
Hemoglobin: 13.2 g/dL (ref 13.0–17.0)
MCH: 28.8 pg (ref 26.0–34.0)
MCHC: 34.3 g/dL (ref 30.0–36.0)
MCV: 84.1 fL (ref 80.0–100.0)
Platelets: 232 K/uL (ref 150–400)
RBC: 4.58 MIL/uL (ref 4.22–5.81)
RDW: 11.6 % (ref 11.5–15.5)
WBC: 10.9 K/uL — ABNORMAL HIGH (ref 4.0–10.5)
nRBC: 0 % (ref 0.0–0.2)

## 2024-05-22 LAB — GLUCOSE, CAPILLARY: Glucose-Capillary: 216 mg/dL — ABNORMAL HIGH (ref 70–99)

## 2024-05-22 LAB — MAGNESIUM: Magnesium: 1.6 mg/dL — ABNORMAL LOW (ref 1.7–2.4)

## 2024-05-22 MED ORDER — METFORMIN HCL 850 MG PO TABS
850.0000 mg | ORAL_TABLET | Freq: Two times a day (BID) | ORAL | Status: DC
  Administered 2024-05-22: 850 mg via ORAL
  Filled 2024-05-22 (×2): qty 1

## 2024-05-22 MED ORDER — POTASSIUM CHLORIDE CRYS ER 20 MEQ PO TBCR
40.0000 meq | EXTENDED_RELEASE_TABLET | Freq: Four times a day (QID) | ORAL | Status: DC
  Administered 2024-05-22: 40 meq via ORAL
  Filled 2024-05-22: qty 2

## 2024-05-22 MED ORDER — MAGNESIUM SULFATE 4 GM/100ML IV SOLN: 4.0000 g | Freq: Once | INTRAVENOUS | Status: DC

## 2024-05-22 MED ORDER — ACCU-CHEK SOFTCLIX LANCETS MISC
1.0000 | 0 refills | Status: AC
  Filled 2024-05-22: qty 100, 25d supply, fill #0

## 2024-05-22 MED ORDER — MAGNESIUM SULFATE 2 GM/50ML IV SOLN: 2.0000 g | Freq: Once | INTRAVENOUS | Status: DC

## 2024-05-22 MED ORDER — LANCET DEVICE MISC
1.0000 | 0 refills | Status: AC
  Filled 2024-05-22: qty 1, fill #0

## 2024-05-22 MED ORDER — LACTATED RINGERS IV SOLN: INTRAVENOUS | Status: AC

## 2024-05-22 MED ORDER — METFORMIN HCL 850 MG PO TABS
850.0000 mg | ORAL_TABLET | Freq: Two times a day (BID) | ORAL | 0 refills | Status: AC
  Filled 2024-05-22: qty 60, 30d supply, fill #0

## 2024-05-22 MED ORDER — BLOOD GLUCOSE MONITOR SYSTEM W/DEVICE KIT
1.0000 | PACK | 0 refills | Status: AC
  Filled 2024-05-22: qty 1, 1d supply, fill #0

## 2024-05-22 MED ORDER — LOSARTAN POTASSIUM 25 MG PO TABS
25.0000 mg | ORAL_TABLET | Freq: Every day | ORAL | 0 refills | Status: DC
  Filled 2024-05-22: qty 30, 30d supply, fill #0

## 2024-05-22 MED ORDER — INSULIN PEN NEEDLE 32G X 4 MM MISC
1.0000 | 0 refills | Status: AC
  Filled 2024-05-22: qty 100, 25d supply, fill #0

## 2024-05-22 MED ORDER — INSULIN GLARGINE 100 UNIT/ML SOLOSTAR PEN
32.0000 [IU] | PEN_INJECTOR | Freq: Every day | SUBCUTANEOUS | 0 refills | Status: AC
  Filled 2024-05-22: qty 15, 46d supply, fill #0

## 2024-05-22 MED ORDER — BLOOD GLUCOSE TEST VI STRP
1.0000 | ORAL_STRIP | 0 refills | Status: AC
  Filled 2024-05-22: qty 100, 25d supply, fill #0

## 2024-05-22 MED ADMIN — Losartan Potassium Tab 50 MG: 25 mg | ORAL | NDC 68180037709

## 2024-05-22 NOTE — TOC Transition Note (Signed)
 Transition of Care [facility]) - Discharge Note   Patient Details  Name: Erik Harper MRN: [id number] Date of Birth: [birth year]  Transition of Care [facility]) CM/SW Contact:  Landry DELENA Senters, RN Phone Number: 05/22/2024, 10:25 AM   Clinical Narrative:     Patient will be discharging to home today, with transportation plan in place.   PCP appt scheduled for patient with [facility]. Info on AVS.  No further needs identified by CM.   Final next level of care: Home/Self Care Barriers to Discharge: No Barriers Identified   Patient Goals and CMS Choice            Discharge Placement                       Discharge Plan and Services Additional resources added to the After Visit Summary for                                       Social Drivers of Health (SDOH) Interventions SDOH Screenings   Food Insecurity: Food Insecurity Present (05/21/2024)  Housing: High Risk (05/21/2024)  Transportation Needs: No Transportation Needs (05/21/2024)  Utilities: At Risk (05/21/2024)  Tobacco Use: Low Risk (05/20/2024)     Readmission Risk Interventions     No data to display

## 2024-05-22 NOTE — Plan of Care (Signed)
  Problem: Education: Goal: Ability to describe self-care measures that may prevent or decrease complications (Diabetes Survival Skills Education) will improve Outcome: Completed/Met Goal: Individualized Educational Video(s) Outcome: Completed/Met   Problem: Coping: Goal: Ability to adjust to condition or change in health will improve Outcome: Completed/Met   Problem: Fluid Volume: Goal: Ability to maintain a balanced intake and output will improve Outcome: Completed/Met   Problem: Health Behavior/Discharge Planning: Goal: Ability to identify and utilize available resources and services will improve Outcome: Completed/Met Goal: Ability to manage health-related needs will improve Outcome: Completed/Met   Problem: Metabolic: Goal: Ability to maintain appropriate glucose levels will improve Outcome: Completed/Met   Problem: Nutritional: Goal: Maintenance of adequate nutrition will improve Outcome: Completed/Met Goal: Progress toward achieving an optimal weight will improve Outcome: Completed/Met   Problem: Skin Integrity: Goal: Risk for impaired skin integrity will decrease Outcome: Completed/Met   Problem: Tissue Perfusion: Goal: Adequacy of tissue perfusion will improve Outcome: Completed/Met   Problem: Education: Goal: Ability to describe self-care measures that may prevent or decrease complications (Diabetes Survival Skills Education) will improve Outcome: Completed/Met Goal: Individualized Educational Video(s) Outcome: Completed/Met   Problem: Cardiac: Goal: Ability to maintain an adequate cardiac output will improve Outcome: Completed/Met   Problem: Health Behavior/Discharge Planning: Goal: Ability to identify and utilize available resources and services will improve Outcome: Completed/Met Goal: Ability to manage health-related needs will improve Outcome: Completed/Met   Problem: Fluid Volume: Goal: Ability to achieve a balanced intake and output will  improve Outcome: Completed/Met   Problem: Metabolic: Goal: Ability to maintain appropriate glucose levels will improve Outcome: Completed/Met   Problem: Nutritional: Goal: Maintenance of adequate nutrition will improve Outcome: Completed/Met Goal: Maintenance of adequate weight for body size and type will improve Outcome: Completed/Met   Problem: Respiratory: Goal: Will regain and/or maintain adequate ventilation Outcome: Completed/Met   Problem: Urinary Elimination: Goal: Ability to achieve and maintain adequate renal perfusion and functioning will improve Outcome: Completed/Met   Problem: Education: Goal: Knowledge of General Education information will improve Description: Including pain rating scale, medication(s)/side effects and non-pharmacologic comfort measures Outcome: Completed/Met   Problem: Health Behavior/Discharge Planning: Goal: Ability to manage health-related needs will improve Outcome: Completed/Met   Problem: Clinical Measurements: Goal: Ability to maintain clinical measurements within normal limits will improve Outcome: Completed/Met Goal: Will remain free from infection Outcome: Completed/Met Goal: Diagnostic test results will improve Outcome: Completed/Met Goal: Respiratory complications will improve Outcome: Completed/Met Goal: Cardiovascular complication will be avoided Outcome: Completed/Met   Problem: Activity: Goal: Risk for activity intolerance will decrease Outcome: Completed/Met   Problem: Nutrition: Goal: Adequate nutrition will be maintained Outcome: Completed/Met   Problem: Coping: Goal: Level of anxiety will decrease Outcome: Completed/Met   Problem: Elimination: Goal: Will not experience complications related to bowel motility Outcome: Completed/Met Goal: Will not experience complications related to urinary retention Outcome: Completed/Met   Problem: Pain Managment: Goal: General experience of comfort will improve and/or  be controlled Outcome: Completed/Met   Problem: Safety: Goal: Ability to remain free from injury will improve Outcome: Completed/Met   Problem: Skin Integrity: Goal: Risk for impaired skin integrity will decrease Outcome: Completed/Met

## 2024-05-22 NOTE — Inpatient Diabetes Management (Signed)
 Inpatient Diabetes Program Recommendations  AACE/ADA: New Consensus Statement on Inpatient Glycemic Control (2015)  Target Ranges:  Prepandial:   less than 140 mg/dL      Peak postprandial:   less than 180 mg/dL (1-2 hours)      Critically ill patients:  140 - 180 mg/dL   Lab Results  Component Value Date   GLUCAP 216 (H) 05/22/2024   HGBA1C 9.7 (H) 05/20/2024    Review of Glycemic Control  Latest Reference Range & Units 05/22/24 07:33  Glucose-Capillary 70 - 99 mg/dL 783 (H)  (H): Data is abnormally high Diabetes history: DM Outpatient Diabetes medications: Metformin  (not taking) Not taking insulin  Current orders for Inpatient glycemic control: Metformin  850 mg BID, Novolog  0-15 units TID & HS, Lantus  30 units QHS   Inpatient Diabetes Program Recommendations:     Spoke with patient to further reinforce concepts discussed for DM management.  Reviewed patient's current A1c of 9.7%. Explained what a A1c is and what it measures. Also reviewed goal A1c with patient, importance of good glucose control @ home, and blood sugar goals. Reviewed CGM with patient, how to obtain additional sensors, downloaded app, when to call MD, hyper vs hypo glycemia and interventions, vascular changes and other commorbidities.  Applied Dexcom to upper right arm.  Reviewed importance of protein, basic CHO counting, ideas on snack choices, goal setting and overall CHO mindfulness. Patient did not want a referral for outpatient education.  Patient doe shave PCP follow up. No additional questions.   Thanks, Tinnie Minus, MSN, RNC-OB Diabetes Coordinator [phone] (8a-5p)

## 2024-05-22 NOTE — Discharge Summary (Signed)
 Physician Discharge Summary  Erik Harper FMW:[id number] DOB: Dec 04, 1995 DOA: 05/19/2024  PCP: Patient, No Pcp Per  Admit date: 05/19/2024 Discharge date: 05/22/2024  Admitted From: (Home) Disposition:  (Home)  Recommendations for Outpatient Follow-up:  Follow up with PCP in 1-2 weeks Please obtain BMP/CBC in one week Please repeat fasting lipid panel during next visit to evaluate for triglyceride level.   Diet recommendation: Carb Modified   Brief/Interim Summary: Erik Harper is a 28 y.o. male with medical history significant for type 2 diabetes mellitus and BMI 45 who presents with nausea and vomiting.   Patient had previously been managed with insulin  but has not used that in over a year.  He reports that he had been using metformin  up until a few days ago.  He has not checked his sugars at home recently, explaining that they had been well-controlled for a long time so he stopped checking.  3 days ago, he developed nausea with nonbloody vomiting.  Symptoms have been worsening and he is unable to tolerate anything by mouth.  He denies abdominal pain, diarrhea, fevers, or urinary symptoms.  He suspects that his sugar became uncontrolled due to recent dietary indiscretion. Workup significant for DKA and admitted for further workup.   Diabetes mellitus, type II, poorly controlled with DKA -DKA present on admission, elevated anion gap, low bicarb level, with hyperglycemia and elevated BHA and ketonuria -A1c 9.7 - Initially on insulin  drip, anion gap closed, transition to subcu insulin , start Levemir  30 units, insulin  sliding scale, as well he was started on metformin , CBG remains on the higher side on current dose of Levemir , but would like to avoid strict glycemic control in short period of time, so he will be discharged on 32 units of Lantus , and he will be discharged on metformin  850 mg p.o. twice daily. -Started on low-dose losartan. - Lipid panel significant for LDL of 66,  triglyceride is elevated but it was not fasting sample.. - Consult by nutritionist about carb modified diet  Hypertension -Started on low-dose losartan specially his diabetic   Nausea and vomiting  - No abdominal pain, fever, or diarrhea  - Likely secondary to DKA and is improving with treatment  - Treat DKA, advance diet as he improves  -Resolved   SIRS  - WBC is 23.5 and HR elevated without fever or apparent infectious process  - Likely reactive, improving   Obesity class III - Body mass index is 44.76 kg/m. Continue with metformin  -Likely will benefit from GLP,  please consider as an outpatient   THC use -  counseled  Hypokalemia Hypomagnesemia -replaced before discharge   Discharge Diagnoses:  Principal Problem:   DKA (diabetic ketoacidosis) (HCC) Active Problems:   Nausea & vomiting   SIRS (systemic inflammatory response syndrome) (HCC)    Discharge Instructions  Discharge Instructions     Amb Referral to Nutrition and Diabetic Education   Complete by: As directed    Increase activity slowly   Complete by: As directed       Allergies as of 05/22/2024   No Known Allergies      Medication List     STOP taking these medications    amLODipine  5 MG tablet Commonly known as: NORVASC    blood glucose meter kit and supplies   INSULIN  SYRINGE .5CC/31GX5/16 31G X 5/16 0.5 ML Misc   metFORMIN  750 MG 24 hr tablet Commonly known as: GLUCOPHAGE -XR Replaced by: metFORMIN  850 MG tablet   NovoLIN  70/30 ReliOn (70-30) 100 UNIT/ML injection Generic  drug: insulin  NPH-regular Human       TAKE these medications    Blood Glucose Monitoring Suppl Devi 1 each by Does not apply route as directed. Dispense based on patient and insurance preference. Use up to four times daily as directed. (FOR ICD-10 E10.9, E11.9).   BLOOD GLUCOSE TEST STRIPS Strp 1 each by Does not apply route as directed. Dispense based on patient and insurance preference. Use up to four  times daily as directed. (FOR ICD-10 E10.9, E11.9).   insulin  glargine 100 UNIT/ML Solostar Pen Commonly known as: LANTUS  Inject 32 Units into the skin at bedtime. May substitute as needed per insurance.   Lancet Device Misc 1 each by Does not apply route as directed. Dispense based on patient and insurance preference. Use up to four times daily as directed. (FOR ICD-10 E10.9, E11.9).   Lancets Misc 1 each by Does not apply route as directed. Dispense based on patient and insurance preference. Use up to four times daily as directed. (FOR ICD-10 E10.9, E11.9).   losartan 25 MG tablet Commonly known as: COZAAR Take 1 tablet (25 mg total) by mouth daily. Start taking on: May 23, 2024   metFORMIN  850 MG tablet Commonly known as: GLUCOPHAGE  Take 1 tablet (850 mg total) by mouth 2 (two) times daily with a meal. Replaces: metFORMIN  750 MG 24 hr tablet   Pen Needles 31G X 5 MM Misc 1 each by Does not apply route as directed. Dispense based on patient and insurance preference. Use up to four times daily as directed. (FOR ICD-10 E10.9, E11.9).         Follow-up Information     [facility] Follow up.   Why: Your appointment is Friday, December 19th at 9:10 AM. This will be in [address]. Please arrive 15 min early to your appointment, bring photo ID, prescription medications, and insurance cards. Contact information: [phone]  [address] [place], [place] [address]               Allergies[1]  Consultations: none   Procedures/Studies: No results found.    Subjective: Denies any complaints, no nausea, no vomiting, tolerating his diet  Discharge Exam: Vitals:   05/21/24 2036 05/22/24 0734  BP: (!) 144/88 119/68  Pulse: 84 76  Resp:  16  Temp: 98.1 F (36.7 C) 98.6 F (37 C)  SpO2:     Vitals:   05/21/24 1305 05/21/24 1549 05/21/24 2036 05/22/24 0734  BP: (!) 156/99 (!) 154/104 (!) 144/88 119/68   Pulse: 91 100 84 76  Resp: 15 16  16   Temp: 98.3 F (36.8 C) 99 F (37.2 C) 98.1 F (36.7 C) 98.6 F (37 C)  TempSrc: Oral Oral Oral Oral  SpO2: 98%     Weight:      Height:        General: Pt is alert, awake, not in acute distress Cardiovascular: RRR, S1/S2 +, no rubs, no gallops Respiratory: CTA bilaterally, no wheezing, no rhonchi Abdominal: Soft, NT, ND, bowel sounds + Extremities: no edema, no cyanosis    The results of significant diagnostics from this hospitalization (including imaging, microbiology, ancillary and laboratory) are listed below for reference.     Microbiology: Recent Results (from the past 240 hours)  Resp panel by RT-PCR (RSV, Flu A&B, Covid) Anterior Nasal Swab     Status: None   Collection Time: 05/19/24 11:09 PM   Specimen: Anterior Nasal Swab  Result Value Ref Range Status  SARS Coronavirus 2 by RT PCR NEGATIVE NEGATIVE Final   Influenza A by PCR NEGATIVE NEGATIVE Final   Influenza B by PCR NEGATIVE NEGATIVE Final    Comment: (NOTE) The Xpert Xpress SARS-CoV-2/FLU/RSV plus assay is intended as an aid in the diagnosis of influenza from Nasopharyngeal swab specimens and should not be used as a sole basis for treatment. Nasal washings and aspirates are unacceptable for Xpert Xpress SARS-CoV-2/FLU/RSV testing.  Fact Sheet for Patients: bloggercourse.com  Fact Sheet for Healthcare Providers: seriousbroker.it  This test is not yet approved or cleared by the [place]  FDA and has been authorized for detection and/or diagnosis of SARS-CoV-2 by FDA under an Emergency Use Authorization (EUA). This EUA will remain in effect (meaning this test can be used) for the duration of the COVID-19 declaration under Section 564(b)(1) of the Act, 21 U.S.C. section 360bbb-3(b)(1), unless the authorization is terminated or revoked.     Resp Syncytial Virus by PCR NEGATIVE NEGATIVE Final    Comment:  (NOTE) Fact Sheet for Patients: bloggercourse.com  Fact Sheet for Healthcare Providers: seriousbroker.it  This test is not yet approved or cleared by the [place]  FDA and has been authorized for detection and/or diagnosis of SARS-CoV-2 by FDA under an Emergency Use Authorization (EUA). This EUA will remain in effect (meaning this test can be used) for the duration of the COVID-19 declaration under Section 564(b)(1) of the Act, 21 U.S.C. section 360bbb-3(b)(1), unless the authorization is terminated or revoked.  Performed at [facility], [address] [place], [place] [address]      Labs: BNP (last 3 results) No results for input(s): BNP in the last 8760 hours. Basic Metabolic Panel: Recent Labs  Lab 05/20/24 0352 05/20/24 0842 05/20/24 1947 05/21/24 0302 05/22/24 0416 05/22/24 0657  NA 136 137 136 139 134*  --   K 4.1 3.8 3.3* 3.3* 3.4*  --   CL 103 102 104 106 101  --   CO2 21* 21* 23 23 21*  --   GLUCOSE 215* 196* 211* 137* 208*  --   BUN 11 10 8 6 6   --   CREATININE 1.20 1.02 0.73 0.86 0.81  --   CALCIUM 9.7 9.7 9.3 9.4 8.7*  --   MG 2.1  --   --   --   --  1.6*   Liver Function Tests: Recent Labs  Lab 05/19/24 2252  AST 32  ALT 42  ALKPHOS 61  BILITOT 2.6*  PROT 9.1*  ALBUMIN 4.7   Recent Labs  Lab 05/19/24 2252  LIPASE 42   No results for input(s): AMMONIA in the last 168 hours. CBC: Recent Labs  Lab 05/19/24 2252 05/19/24 2347 05/20/24 0352 05/21/24 0302 05/22/24 0416  WBC 23.5*  --  22.0* 12.8* 10.9*  NEUTROABS 18.3*  --   --   --   --   HGB 16.3 16.7  17.0 15.3 14.4 13.2  HCT 47.8 49.0  50.0 44.9 42.2 38.5*  MCV 85.1  --  84.7 85.1 84.1  PLT 331  --  289 238 232   Cardiac Enzymes: No results for input(s): CKTOTAL, CKMB, CKMBINDEX, TROPONINI in the last 168 hours. BNP: Invalid input(s): POCBNP CBG: Recent Labs  Lab 05/21/24 0832 05/21/24 1254  05/21/24 1549 05/21/24 2155 05/22/24 0733  GLUCAP 227* 227* 270* 216* 216*   D-Dimer No results for input(s): DDIMER in the last 72 hours. Hgb A1c Recent Labs    05/20/24 0352  HGBA1C 9.7*  Lipid Profile Recent Labs    05/22/24 0707  CHOL 165  HDL 19*  LDLCALC 66  TRIG 599*  CHOLHDL 8.7   Thyroid function studies No results for input(s): TSH, T4TOTAL, T3FREE, THYROIDAB in the last 72 hours.  Invalid input(s): FREET3 Anemia work up No results for input(s): VITAMINB12, FOLATE, FERRITIN, TIBC, IRON, RETICCTPCT in the last 72 hours. Urinalysis    Component Value Date/Time   COLORURINE YELLOW 05/20/2024 0050   APPEARANCEUR CLEAR 05/20/2024 0050   LABSPEC 1.028 05/20/2024 0050   PHURINE 5.0 05/20/2024 0050   GLUCOSEU >=500 (A) 05/20/2024 0050   HGBUR MODERATE (A) 05/20/2024 0050   BILIRUBINUR NEGATIVE 05/20/2024 0050   KETONESUR 80 (A) 05/20/2024 0050   PROTEINUR 100 (A) 05/20/2024 0050   NITRITE NEGATIVE 05/20/2024 0050   LEUKOCYTESUR NEGATIVE 05/20/2024 0050   Sepsis Labs Recent Labs  Lab 05/19/24 2252 05/20/24 0352 05/21/24 0302 05/22/24 0416  WBC 23.5* 22.0* 12.8* 10.9*   Microbiology Recent Results (from the past 240 hours)  Resp panel by RT-PCR (RSV, Flu A&B, Covid) Anterior Nasal Swab     Status: None   Collection Time: 05/19/24 11:09 PM   Specimen: Anterior Nasal Swab  Result Value Ref Range Status   SARS Coronavirus 2 by RT PCR NEGATIVE NEGATIVE Final   Influenza A by PCR NEGATIVE NEGATIVE Final   Influenza B by PCR NEGATIVE NEGATIVE Final    Comment: (NOTE) The Xpert Xpress SARS-CoV-2/FLU/RSV plus assay is intended as an aid in the diagnosis of influenza from Nasopharyngeal swab specimens and should not be used as a sole basis for treatment. Nasal washings and aspirates are unacceptable for Xpert Xpress SARS-CoV-2/FLU/RSV testing.  Fact Sheet for Patients: bloggercourse.com  Fact Sheet for  Healthcare Providers: seriousbroker.it  This test is not yet approved or cleared by the [place]  FDA and has been authorized for detection and/or diagnosis of SARS-CoV-2 by FDA under an Emergency Use Authorization (EUA). This EUA will remain in effect (meaning this test can be used) for the duration of the COVID-19 declaration under Section 564(b)(1) of the Act, 21 U.S.C. section 360bbb-3(b)(1), unless the authorization is terminated or revoked.     Resp Syncytial Virus by PCR NEGATIVE NEGATIVE Final    Comment: (NOTE) Fact Sheet for Patients: bloggercourse.com  Fact Sheet for Healthcare Providers: seriousbroker.it  This test is not yet approved or cleared by the [place]  FDA and has been authorized for detection and/or diagnosis of SARS-CoV-2 by FDA under an Emergency Use Authorization (EUA). This EUA will remain in effect (meaning this test can be used) for the duration of the COVID-19 declaration under Section 564(b)(1) of the Act, 21 U.S.C. section 360bbb-3(b)(1), unless the authorization is terminated or revoked.  Performed at [facility], [address], [place], [place] [address]      Time coordinating discharge: Over 30 minutes  SIGNED:   Brayton Lye, MD  [facility] 05/22/2024, 10:12 AM Pager   If 7PM-7AM, please contact night-coverage [email or web address]     [1] No Known Allergies

## 2024-05-22 NOTE — Progress Notes (Signed)
 PT DISCHARGED HOME PER [facility] ORDERS. PRINTED AND VERBAL DISCHARGE INSTRUCTIONS GIVEN TO PT AT THIS TIME. PT LEAVING VIA AMBULATORY, REFUSED WHEELCHAIR.

## 2024-05-29 ENCOUNTER — Ambulatory Visit (HOSPITAL_BASED_OUTPATIENT_CLINIC_OR_DEPARTMENT_OTHER): Admitting: Family Medicine

## 2024-05-29 ENCOUNTER — Other Ambulatory Visit (HOSPITAL_COMMUNITY): Payer: Self-pay

## 2024-05-29 ENCOUNTER — Telehealth (HOSPITAL_BASED_OUTPATIENT_CLINIC_OR_DEPARTMENT_OTHER): Payer: Self-pay | Admitting: Pharmacy Technician

## 2024-05-29 ENCOUNTER — Encounter (HOSPITAL_BASED_OUTPATIENT_CLINIC_OR_DEPARTMENT_OTHER): Payer: Self-pay

## 2024-05-29 VITALS — BP 146/95 | HR 85 | Ht 72.0 in | Wt 331.0 lb

## 2024-05-29 DIAGNOSIS — Z794 Long term (current) use of insulin: Secondary | ICD-10-CM

## 2024-05-29 DIAGNOSIS — E1165 Type 2 diabetes mellitus with hyperglycemia: Secondary | ICD-10-CM

## 2024-05-29 DIAGNOSIS — I10 Essential (primary) hypertension: Secondary | ICD-10-CM

## 2024-05-29 DIAGNOSIS — E119 Type 2 diabetes mellitus without complications: Secondary | ICD-10-CM | POA: Insufficient documentation

## 2024-05-29 MED ORDER — DEXCOM G7 SENSOR MISC: 1.0000 "application " | 2 refills | Status: AC

## 2024-05-29 MED ORDER — LOSARTAN POTASSIUM 25 MG PO TABS: 25.0000 mg | ORAL_TABLET | Freq: Every day | ORAL | 1 refills | Status: AC

## 2024-05-29 MED ORDER — OZEMPIC (0.25 OR 0.5 MG/DOSE) 2 MG/3ML ~~LOC~~ SOPN: 0.2500 mg | PEN_INJECTOR | SUBCUTANEOUS | 1 refills | Status: AC

## 2024-05-29 NOTE — Assessment & Plan Note (Signed)
 Most recent A1c done at time of hospitalization shows that blood sugar has been suboptimally controlled, this was elevated at 9.7%. He continues with Lantus  as recommended at time of discharge, no concerns with this today..  Blood sugars on still slightly above goal, however would avoid strict glycemic control with plan for gradual improvement in blood sugars moving forward. Patient has done well with initial dosing of GLP-1 medication in the past and I do feel that this would be an appropriate medication for him.  We can look to start with low-dose of Ozempic as this appears to be covered better by insurance.  Cautioned on potential side effects.  Since we will be starting this medication, will not adjust insulin  at this time.  Did discuss that as Ozempic is titrated, we will need to keep a close eye on the blood sugars as dose of insulin  may need to be adjusted to avoid issues with hypoglycemia. He will reach out to office towards the end of initial month with Ozempic and if tolerating well, we can increase to 0.5 mg dose of Ozempic. Urine ACR due, we will check this today Foot exam completed today, documented in chart. Will need to ensure that patient is having routine retinopathy screening with ophthalmology. Discuss pneumococcal vaccination at next visit

## 2024-05-29 NOTE — Telephone Encounter (Signed)
 Pharmacy Patient Advocate Encounter   Received notification from CoverMyMeds that prior authorization for Dexcom G7 Sensor is required/requested.   Insurance verification completed.   The patient is insured through [employer].   Per test claim: PA required; PA submitted to above mentioned insurance via Latent Key/confirmation #/[employer] Status is pending

## 2024-05-29 NOTE — Progress Notes (Signed)
 "  New Patient Office Visit  Subjective   Patient ID: Erik Harper, male    DOB: [birth year]  Age: 28 y.o. MRN: [id number]  CC:  Chief Complaint  Patient presents with   New Patient (Initial Visit)    Patient is here to get establish with PCP. States he wants to know which medicine would be best for his diabetes. Denies any other concerns for today's visit.     HPI [name] presents to establish care Last PCP - none  Discussed the use of AI scribe software for clinical note transcription with the patient, who gave verbal consent to proceed.  History of Present Illness Erik Harper is a 28 year old male with type 2 diabetes who presents for follow-up after hospital discharge and to establish care with a primary care physician.  He has a history of type 2 diabetes and was previously managed by an endocrinologist in [place] . Since relocating, he has not established care with a new specialist. He was on insulin , metformin , and Ozempic  in the past. He notes that Ozempic  did not significantly reduce his appetite, and he is interested in restarting it or a similar medication.  Currently, he is using long-acting insulin  only and regularly checks his blood sugar levels. His blood sugar readings have been over 200 mg/dL, with the lowest being 140-150 mg/dL in the past week. He does not eat breakfast and typically has his first meal around 3 PM.  He was recently started on losartan  during a hospital stay and has not experienced any issues with this medication. No side effects from his current medications, including losartan  and insulin .  He uses a G7 sensor to monitor his blood sugar but notes a discrepancy of more than 30 units between the sensor readings and manual fingerstick checks. Both the sensor and fingerstick supplies are new.  He has been living in the current area since 2021, originally from [facility], [place] , and attended [employer].  Outpatient Encounter  Medications as of 05/29/2024  Medication Sig   Accu-Chek Softclix Lancets lancets Dispense based on patient and insurance preference. Use up to four times daily as directed. (FOR ICD-10 E10.9, E11.9).   Blood Glucose Monitoring Suppl (BLOOD GLUCOSE MONITOR SYSTEM) w/Device KIT Dispense based on patient and insurance preference. Use up to four times daily as directed. (FOR ICD-10 E10.9, E11.9).   Continuous Glucose Sensor (DEXCOM G7 SENSOR) MISC 1 application  by Does not apply route every 14 (fourteen) days.   Glucose Blood (BLOOD GLUCOSE TEST STRIPS) STRP Dispense based on patient and insurance preference. Use up to four times daily as directed. (FOR ICD-10 E10.9, E11.9).   insulin  glargine (LANTUS ) 100 UNIT/ML Solostar Pen Inject 32 Units into the skin at bedtime. May substitute as needed per insurance.   Insulin  Pen Needle 32G X 4 MM MISC Dispense based on patient and insurance preference. Use up to four times daily as directed. (FOR ICD-10 E10.9, E11.9).   Lancet Device MISC 1 each by Does not apply route as directed. Dispense based on patient and insurance preference. Use up to four times daily as directed. (FOR ICD-10 E10.9, E11.9).   metFORMIN  (GLUCOPHAGE ) 850 MG tablet Take 1 tablet (850 mg total) by mouth 2 (two) times daily with a meal.   Semaglutide ,0.25 or 0.5MG /DOS, (OZEMPIC , 0.25 OR 0.5 MG/DOSE,) 2 MG/3ML SOPN Inject 0.25 mg into the skin once a week.   [DISCONTINUED] losartan  (COZAAR ) 25 MG tablet Take 1 tablet (25 mg total) by mouth daily.  losartan  (COZAAR ) 25 MG tablet Take 1 tablet (25 mg total) by mouth daily.   No facility-administered encounter medications on file as of 05/29/2024.    Past Medical History:  Diagnosis Date   DKA (diabetic ketoacidoses) 05/2018   NEW DIABETES TYPE 2   Hypertension    Nausea & vomiting 05/21/2018    Past Surgical History:  Procedure Laterality Date   TONSILLECTOMY      Family History  Problem Relation Age of Onset   Diabetes  Mellitus II Mother     Social History   Socioeconomic History   Marital status: Single    Spouse name: Not on file   Number of children: Not on file   Years of education: Not on file   Highest education level: Bachelor's degree (e.g., BA, AB, BS)  Occupational History   Not on file  Tobacco Use   Smoking status: Never   Smokeless tobacco: Never  Vaping Use   Vaping status: Never Used  Substance and Sexual Activity   Alcohol use: Not Currently    Comment: rarely   Drug use: Yes    Types: Marijuana    Comment: PT reports marijuana use in the past   Sexual activity: Yes    Birth control/protection: None  Other Topics Concern   Not on file  Social History Narrative   Not on file   Social Drivers of Health   Tobacco Use: Low Risk (05/29/2024)   Patient History    Smoking Tobacco Use: Never    Smokeless Tobacco Use: Never    Passive Exposure: Not on file  Financial Resource Strain: Medium Risk (05/29/2024)   Overall Financial Resource Strain (CARDIA)    Difficulty of Paying Living Expenses: Somewhat hard  Food Insecurity: Food Insecurity Present (05/29/2024)   Epic    Worried About Programme Researcher, Broadcasting/film/video in the Last Year: Sometimes true    Ran Out of Food in the Last Year: Sometimes true  Transportation Needs: No Transportation Needs (05/29/2024)   Epic    Lack of Transportation (Medical): No    Lack of Transportation (Non-Medical): No  Physical Activity: Sufficiently Active (05/29/2024)   Exercise Vital Sign    Days of Exercise per Week: 5 days    Minutes of Exercise per Session: 30 min  Stress: No Stress Concern Present (05/29/2024)   [employer] of Occupational Health - Occupational Stress Questionnaire    Feeling of Stress: Not at all  Social Connections: Unknown (05/29/2024)   Social Connection and Isolation Panel    Frequency of Communication with Friends and Family: Twice a week    Frequency of Social Gatherings with Friends and Family: Patient declined     Attends Religious Services: Never    Database Administrator or Organizations: No    Attends Engineer, Structural: Not on file    Marital Status: Never married  Intimate Partner Violence: Not At Risk (05/21/2024)   Epic    Fear of Current or Ex-Partner: No    Emotionally Abused: No    Physically Abused: No    Sexually Abused: No  Depression (PHQ2-9): Low Risk (05/29/2024)   Depression (PHQ2-9)    PHQ-2 Score: 0  Alcohol Screen: Low Risk (05/29/2024)   Alcohol Screen    Last Alcohol Screening Score (AUDIT): 2  Housing: Unknown (05/29/2024)   Epic    Unable to Pay for Housing in the Last Year: Patient declined    Number of Times Moved in the Last Year: Not  on file    Homeless in the Last Year: No  Recent Concern: Housing - High Risk (05/21/2024)   Epic    Unable to Pay for Housing in the Last Year: Yes    Number of Times Moved in the Last Year: Not on file    Homeless in the Last Year: Yes  Utilities: At Risk (05/21/2024)   Epic    Threatened with loss of utilities: Yes  Health Literacy: Adequate Health Literacy (05/29/2024)   B1300 Health Literacy    Frequency of need for help with medical instructions: Never   Objective   BP (!) 146/95 (BP Location: Right Arm, Patient Position: Sitting, Cuff Size: Large)   Pulse 85   Ht 6' (1.829 m)   Wt (!) 331 lb (150.1 kg)   SpO2 100%   BMI 44.89 kg/m   Physical Exam  28 year old male in no acute distress Cardiovascular exam with regular rate and rhythm Lungs clear to auscultation bilaterally  Assessment & Plan:   Type 2 diabetes mellitus with hyperglycemia, with long-term current use of insulin  (HCC) Assessment & Plan: Most recent A1c done at time of hospitalization shows that blood sugar has been suboptimally controlled, this was elevated at 9.7%. He continues with Lantus  as recommended at time of discharge, no concerns with this today..  Blood sugars on still slightly above goal, however would avoid strict  glycemic control with plan for gradual improvement in blood sugars moving forward. Patient has done well with initial dosing of GLP-1 medication in the past and I do feel that this would be an appropriate medication for him.  We can look to start with low-dose of Ozempic  as this appears to be covered better by insurance.  Cautioned on potential side effects.  Since we will be starting this medication, will not adjust insulin  at this time.  Did discuss that as Ozempic  is titrated, we will need to keep a close eye on the blood sugars as dose of insulin  may need to be adjusted to avoid issues with hypoglycemia. He will reach out to office towards the end of initial month with Ozempic  and if tolerating well, we can increase to 0.5 mg dose of Ozempic . Urine ACR due, we will check this today Foot exam completed today, documented in chart. Will need to ensure that patient is having routine retinopathy screening with ophthalmology. Discuss pneumococcal vaccination at next visit  Orders: -     Ozempic  (0.25 or 0.5 MG/DOSE); Inject 0.25 mg into the skin once a week.  Dispense: 3 mL; Refill: 1 -     Dexcom G7 Sensor; 1 application  by Does not apply route every 14 (fourteen) days.  Dispense: 6 each; Refill: 2 -     Microalbumin / creatinine urine ratio -     Basic metabolic panel with GFR  Essential hypertension Assessment & Plan: Blood pressure borderline elevated in office.  Patient continues with losartan  which was started at discharge.  Denies any issues with medication.  Has not been checking blood pressure at home. Can continue with losartan  at this time.  Will monitor blood pressure closely, consider adjusting dose of losartan  if blood pressure remains elevated. Recommend intermittent monitoring of blood pressure at home, DASH diet.  Orders: -     CBC with Differential/Platelet -     Basic metabolic panel with GFR  Other orders -     Losartan  Potassium; Take 1 tablet (25 mg total) by mouth  daily.  Dispense: 90 tablet; Refill: 1  Return in about 2 months (around 07/30/2024) for diabetes, hypertension.   Spent 49 minutes on this patient encounter, including preparation, chart review, face-to-face counseling with patient and coordination of care, and documentation of encounter   ___________________________________________ Kataleah Bejar [place], MD, ABFM, CAQSM Primary Care and Sports Medicine [facility] "

## 2024-05-29 NOTE — Telephone Encounter (Signed)
 Pharmacy Patient Advocate Encounter  Received notification from [employer] that Prior Authorization for Dexcom G7 Sensor  has been APPROVED from 05/29/2024 to 11/25/2024. Ran test claim, Copay is $0.00. This test claim was processed through [facility]- copay amounts may vary at other pharmacies due to pharmacy/plan contracts, or as the patient moves through the different stages of their insurance plan.   PA #/Case ID/Reference #: [id number]

## 2024-05-29 NOTE — Assessment & Plan Note (Signed)
 Blood pressure borderline elevated in office.  Patient continues with losartan  which was started at discharge.  Denies any issues with medication.  Has not been checking blood pressure at home. Can continue with losartan  at this time.  Will monitor blood pressure closely, consider adjusting dose of losartan  if blood pressure remains elevated. Recommend intermittent monitoring of blood pressure at home, DASH diet.

## 2024-05-30 LAB — BASIC METABOLIC PANEL WITH GFR
BUN/Creatinine Ratio: 10 (ref 9–20)
BUN: 8 mg/dL (ref 6–20)
CO2: 23 mmol/L (ref 20–29)
Calcium: 9.5 mg/dL (ref 8.7–10.2)
Chloride: 100 mmol/L (ref 96–106)
Creatinine, Ser: 0.82 mg/dL (ref 0.76–1.27)
Glucose: 217 mg/dL — ABNORMAL HIGH (ref 70–99)
Potassium: 4 mmol/L (ref 3.5–5.2)
Sodium: 137 mmol/L (ref 134–144)
eGFR: 123 mL/min/1.73

## 2024-05-30 LAB — CBC WITH DIFFERENTIAL/PLATELET
Basophils Absolute: 0.1 x10E3/uL (ref 0.0–0.2)
Basos: 1 %
EOS (ABSOLUTE): 0.1 x10E3/uL (ref 0.0–0.4)
Eos: 1 %
Hematocrit: 38.6 % (ref 37.5–51.0)
Hemoglobin: 12.5 g/dL — ABNORMAL LOW (ref 13.0–17.7)
Immature Grans (Abs): 0.2 x10E3/uL — ABNORMAL HIGH (ref 0.0–0.1)
Immature Granulocytes: 2 %
Lymphocytes Absolute: 2.6 x10E3/uL (ref 0.7–3.1)
Lymphs: 24 %
MCH: 28.9 pg (ref 26.6–33.0)
MCHC: 32.4 g/dL (ref 31.5–35.7)
MCV: 89 fL (ref 79–97)
Monocytes Absolute: 0.9 x10E3/uL (ref 0.1–0.9)
Monocytes: 8 %
Neutrophils Absolute: 7.1 x10E3/uL — ABNORMAL HIGH (ref 1.4–7.0)
Neutrophils: 64 %
Platelets: 274 x10E3/uL (ref 150–450)
RBC: 4.32 x10E6/uL (ref 4.14–5.80)
RDW: 12 % (ref 11.6–15.4)
WBC: 11 x10E3/uL — ABNORMAL HIGH (ref 3.4–10.8)

## 2024-05-30 LAB — MICROALBUMIN / CREATININE URINE RATIO
Creatinine, Urine: 182.2 mg/dL
Microalb/Creat Ratio: 5 mg/g{creat} (ref 0–29)
Microalbumin, Urine: 9.1 ug/mL

## 2024-06-01 ENCOUNTER — Ambulatory Visit (HOSPITAL_BASED_OUTPATIENT_CLINIC_OR_DEPARTMENT_OTHER): Payer: Self-pay | Admitting: Family Medicine
